# Patient Record
Sex: Female | Born: 1960 | Race: White | Hispanic: No | Marital: Married | Smoking: Never smoker
Health system: Southern US, Academic
[De-identification: ages and names within clinical notes are randomized; demographics above are authoritative.]

## PROBLEM LIST (undated history)

## (undated) DIAGNOSIS — R251 Tremor, unspecified: Secondary | ICD-10-CM

## (undated) DIAGNOSIS — F319 Bipolar disorder, unspecified: Secondary | ICD-10-CM

## (undated) DIAGNOSIS — I10 Essential (primary) hypertension: Secondary | ICD-10-CM

## (undated) DIAGNOSIS — E119 Type 2 diabetes mellitus without complications: Secondary | ICD-10-CM

## (undated) DIAGNOSIS — O039 Complete or unspecified spontaneous abortion without complication: Secondary | ICD-10-CM

## (undated) DIAGNOSIS — E039 Hypothyroidism, unspecified: Secondary | ICD-10-CM

## (undated) DIAGNOSIS — K219 Gastro-esophageal reflux disease without esophagitis: Secondary | ICD-10-CM

## (undated) DIAGNOSIS — E785 Hyperlipidemia, unspecified: Secondary | ICD-10-CM

## (undated) HISTORY — PX: HX HERNIA REPAIR: SHX51

## (undated) HISTORY — PX: ESOPHAGOGASTRODUODENOSCOPY: SHX1529

## (undated) HISTORY — DX: Essential (primary) hypertension: I10

## (undated) HISTORY — DX: Hypothyroidism, unspecified: E03.9

## (undated) HISTORY — PX: HX CATARACT REMOVAL: SHX102

## (undated) HISTORY — DX: Bipolar disorder, unspecified: F31.9

## (undated) HISTORY — DX: Hyperlipidemia, unspecified: E78.5

## (undated) HISTORY — PX: HX GALL BLADDER SURGERY/CHOLE: SHX55

## (undated) HISTORY — PX: COLONOSCOPY: WVUENDOPRO10

## (undated) HISTORY — DX: Complete or unspecified spontaneous abortion without complication: O03.9

## (undated) NOTE — Progress Notes (Signed)
 Formatting of this note might be different from the original.  Subjective   Patient ID: Julie Cain is a 20 y.o. female presenting to the Urgent Care with a chief complaint of Suspicious Skin Lesion (R side of back, concerned about shingles).    History provided by:  Patient  Rash  This is a new problem. Episode onset: x 2 - 3 days. The affected locations include the back. The rash is characterized by blistering, itchiness and pain. She was exposed to nothing.     Objective   BP 118/76 (BP Location: Left arm, Patient Position: Sitting, BP Cuff Size: Large adult)   Pulse 90   Temp 36.6 C (97.9 F) (Oral)   Resp 18   Ht 1.575 m (5' 2)   Wt 97.1 kg (214 lb)   SpO2 99%   BMI 39.14 kg/m     Physical Exam  Vitals and nursing note reviewed.   Constitutional:       Appearance: Normal appearance. She is normal weight.   Skin:     Findings: Rash present.   Neurological:      Mental Status: She is alert and oriented to person, place, and time.         Assessment & Plan    Assessment & Plan  Herpes zoster without complication            In-House Lab Results:   No results found for this or any previous visit.     In-House Imaging Reads:        Procedure Documentation:  Procedures     ED Course & MDM   MDM - Medical Decision Making: Home with return precautions  Electronically signed by Avel Larve, PA-C at 10/27/2023 11:55 AM EDT

---

## 2001-01-10 ENCOUNTER — Other Ambulatory Visit (HOSPITAL_COMMUNITY): Payer: Self-pay

## 2013-09-09 ENCOUNTER — Encounter (INDEPENDENT_AMBULATORY_CARE_PROVIDER_SITE_OTHER): Payer: Self-pay

## 2013-09-09 NOTE — Progress Notes (Signed)
We have received patients records for their new patient visit.  Ralph DowdyJodie Uday Jantz, RN  09/09/2013, 09:27

## 2013-09-16 ENCOUNTER — Ambulatory Visit (INDEPENDENT_AMBULATORY_CARE_PROVIDER_SITE_OTHER): Payer: Self-pay | Admitting: Gastroenterology

## 2020-01-07 ENCOUNTER — Ambulatory Visit: Payer: 59 | Attending: PHYSICIAN ASSISTANT

## 2020-03-05 ENCOUNTER — Other Ambulatory Visit (HOSPITAL_COMMUNITY): Payer: Self-pay

## 2020-03-05 LAB — EXTERNAL COVID-19 MOLECULAR RESULT: External 2019-n-CoV/SARS-CoV-2: POSITIVE — AB

## 2020-09-02 IMAGING — US ABD LIMITED
1 series · 14 of 25 positions shown · non-contrast
Comparison: None.

﻿EXAM: ROMEO PROFESSIONAL READ ABD U/S LMTD
INDICATION: B16.

[Series 1: abd limited · 14 of 45 slices shown]
[im 1/45]
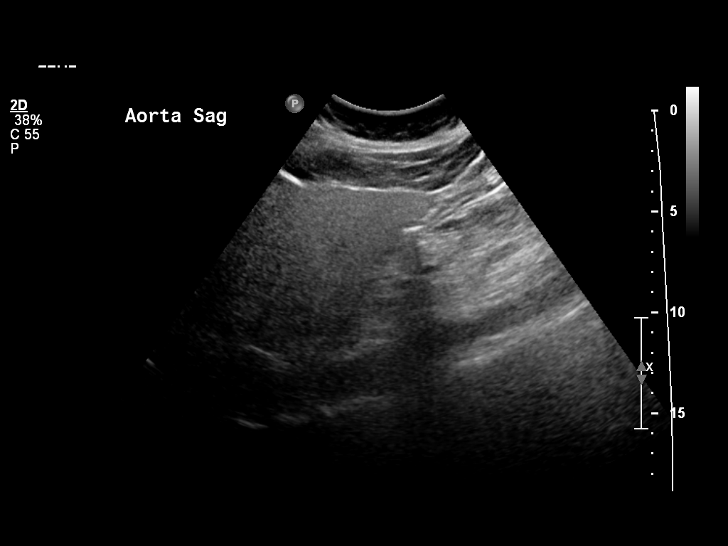
[im 4/45]
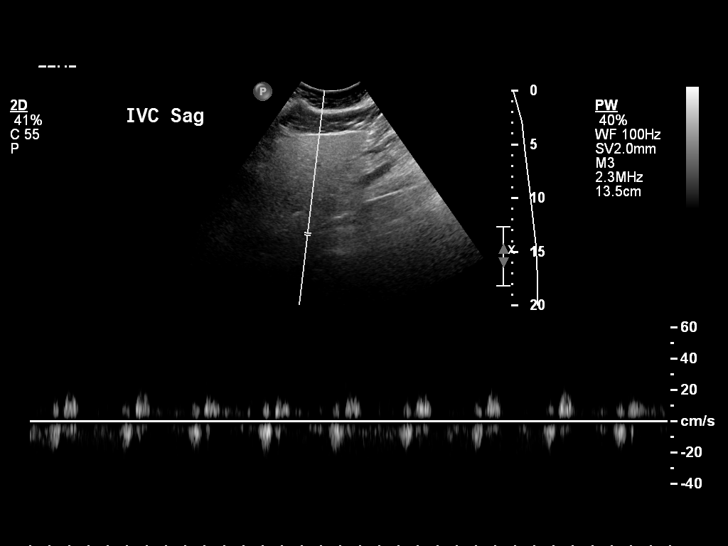
[im 8/45]
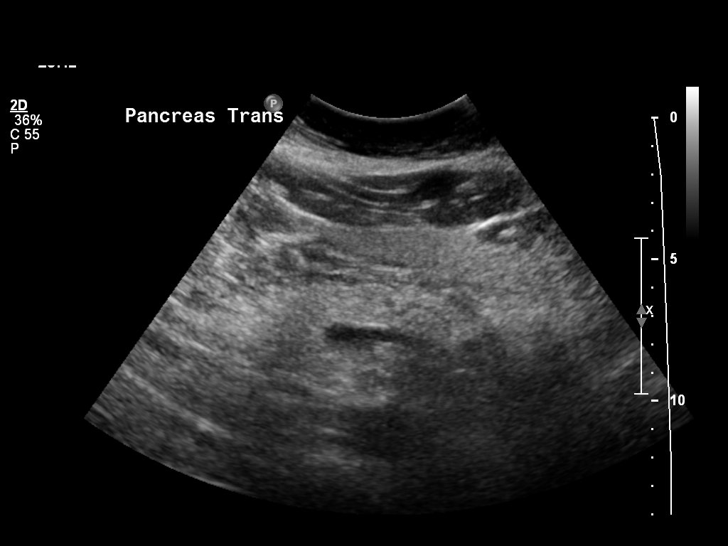
[im 12/45]
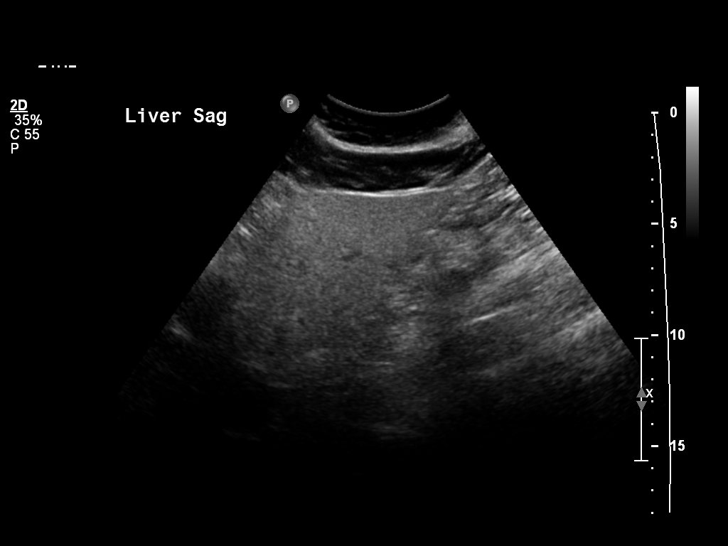
[im 15/45]
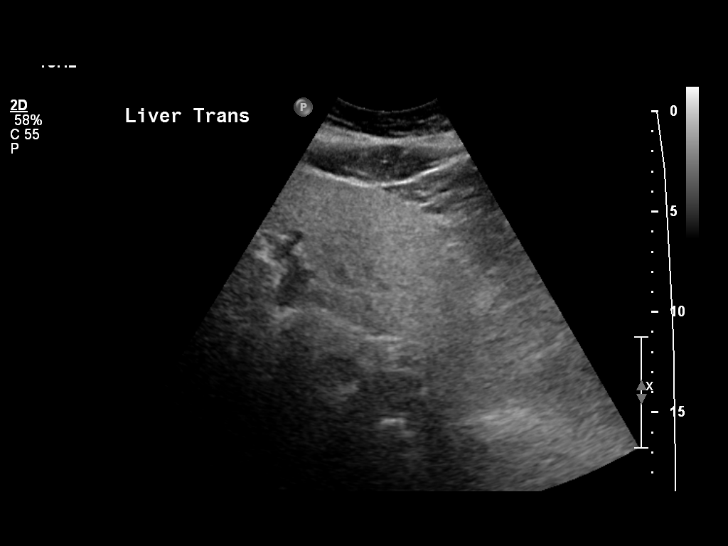
[im 17/45]
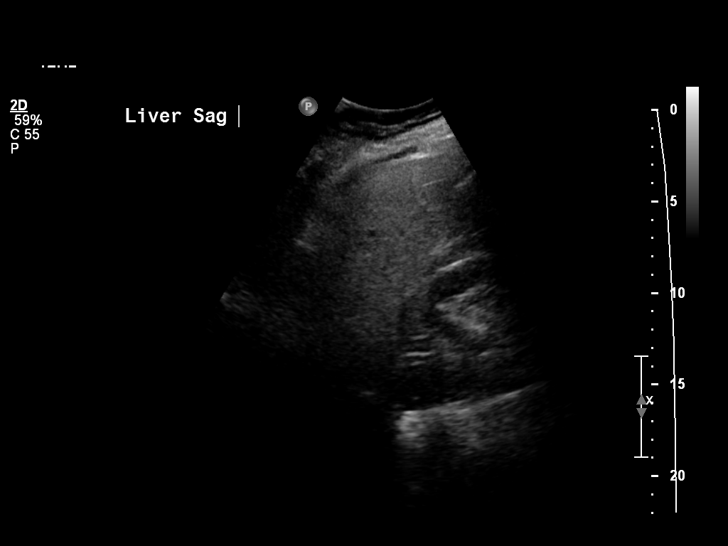
[im 21/45]
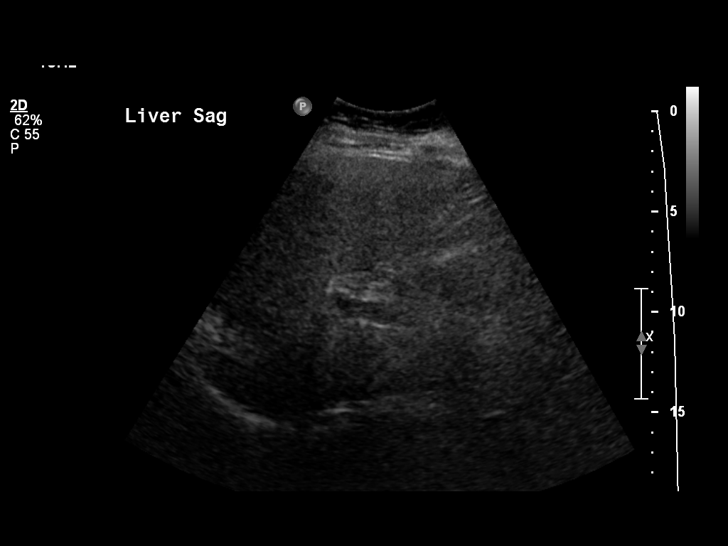
[im 24/45]
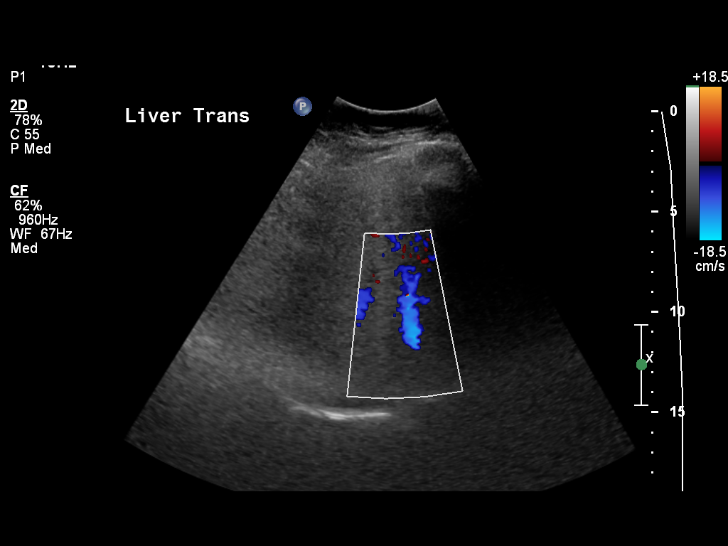
[im 28/45]
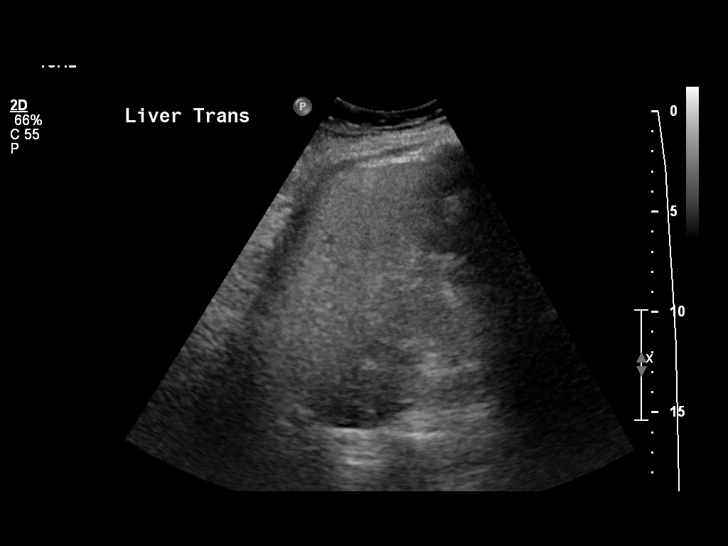
[im 30/45]
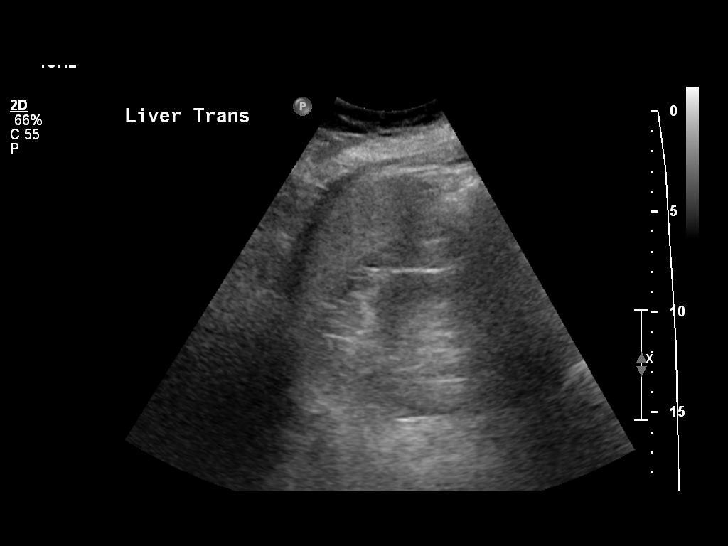
[im 34/45]
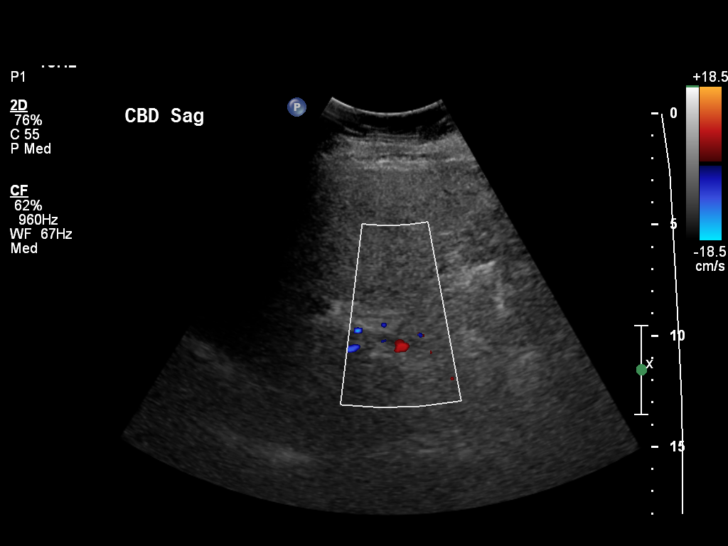
[im 37/45]
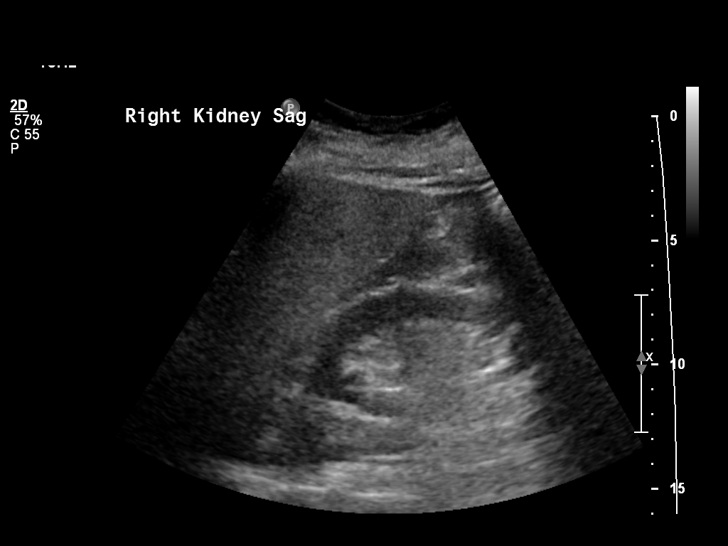
[im 41/45]
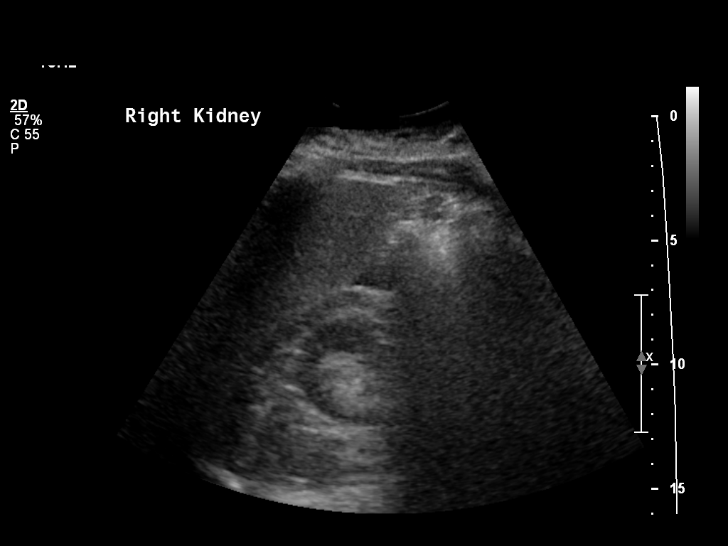
[im 45/45]
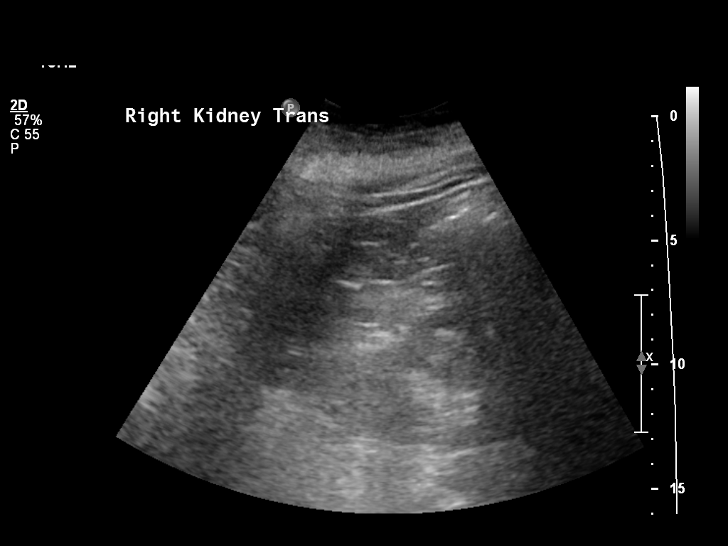

[14 of 25 positions shown; findings below may reference images not displayed]

FINDINGS: Liver is echogenic compatible with fatty infiltration. Fatty infiltration limits evaluation for focal hepatic mass. There is no intra or extrahepatic biliary ductal dilation. Common bile duct measures 2.5 mm. Gallbladder is surgically absent. Pancreas is incompletely visualized due to artifact from overlying bowel gas. Right kidney measures 9.5 cm and is unremarkable. 

Visualized abdominal aorta is without aneurysmal dilatation. IVC is normal. Portal vein measures 9 mm in diameter and demonstrates appropriate hepatopetal direction of flow. Hepatic veins are also patent. There is no ascites.
IMPRESSION: 1. Fatty liver.

2. Prior cholecystectomy.

3. Pancreas incompletely visualized due to artifact from overlying bowel gas.

## 2020-11-15 IMAGING — MR MRI LUMBAR SPINE WITHOUT CONTRAST
5 of 6 series · 32 of 48 positions shown · IV contrast (gadolinium)
Comparison: None available.

﻿EXAM:  [id number]   MRI LUMBAR SPINE WITHOUT CONTRAST
INDICATION: Back pain.
TECHNIQUE: Multiplanar multisequential MRI of the lumbar spine was performed without gadolinium contrast.

[Series 5: T2 · sagittal · 4.0mm · 0.94mm/px · 6 of 13 slices shown (1 of 3)]
[im 1/13]
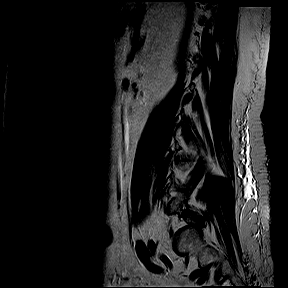
[im 3/13]
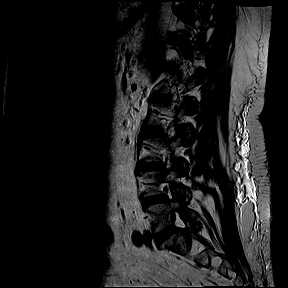
[im 5/13]
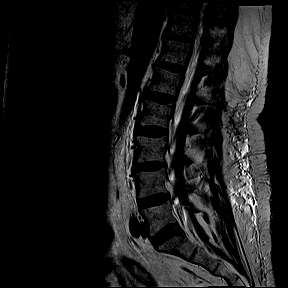
[im 8/13]
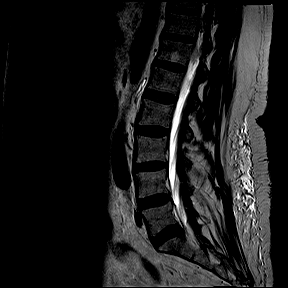
[im 10/13]
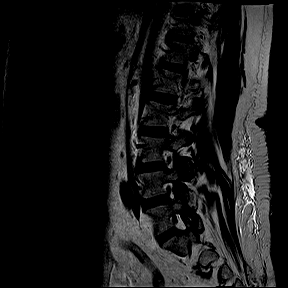
[im 13/13]
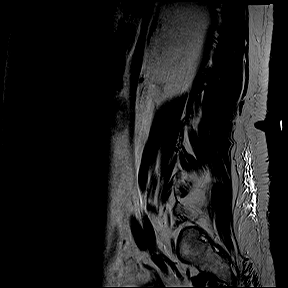

[Series 6: T1 · sagittal · 4.0mm · 0.94mm/px · 6 of 13 slices shown (1 of 2)]
[im 1/13]
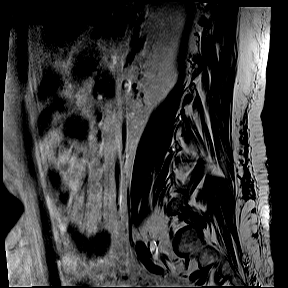
[im 3/13]
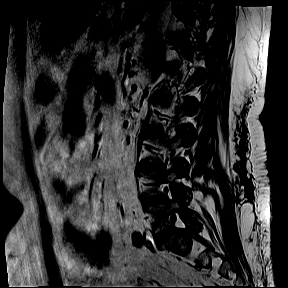
[im 5/13]
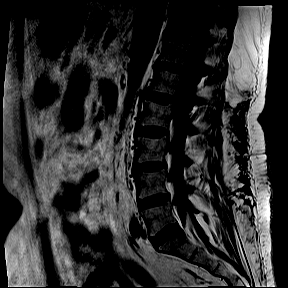
[im 8/13]
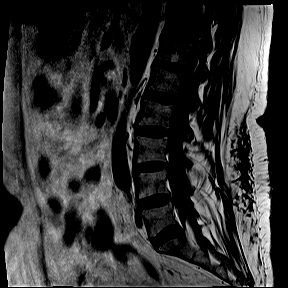
[im 10/13]
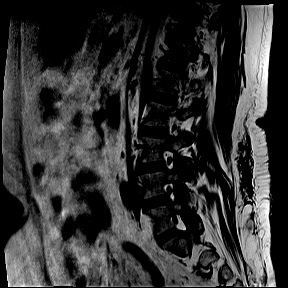
[im 13/13]
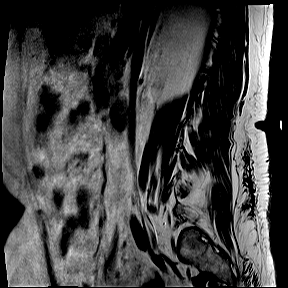

[Series 8: T2 · coronal · 5.0mm · 0.82mm/px · 8 of 18 slices shown (2 of 3)]
[im 1/18]
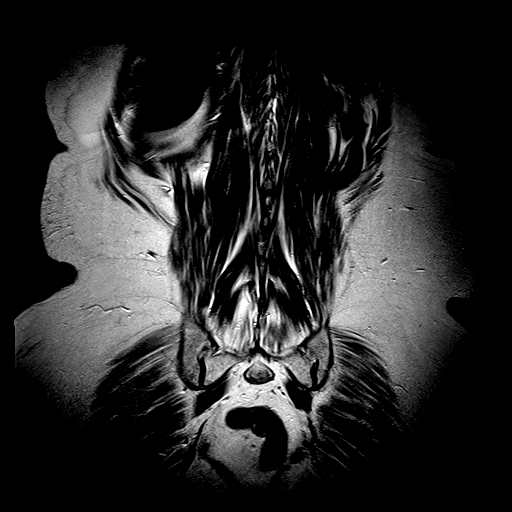
[im 3/18]
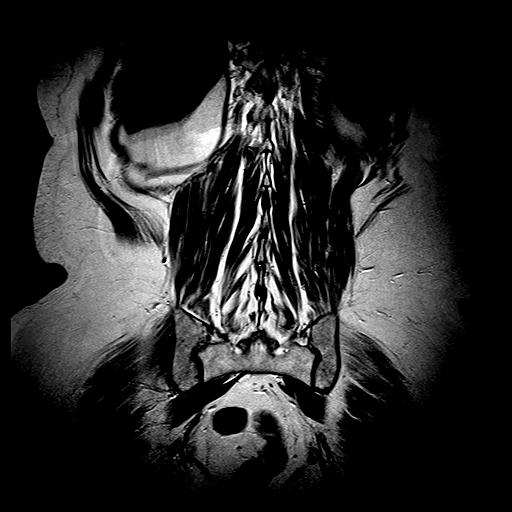
[im 5/18]
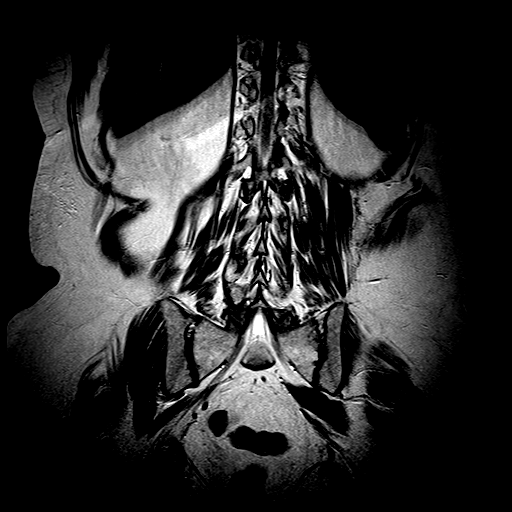
[im 8/18]
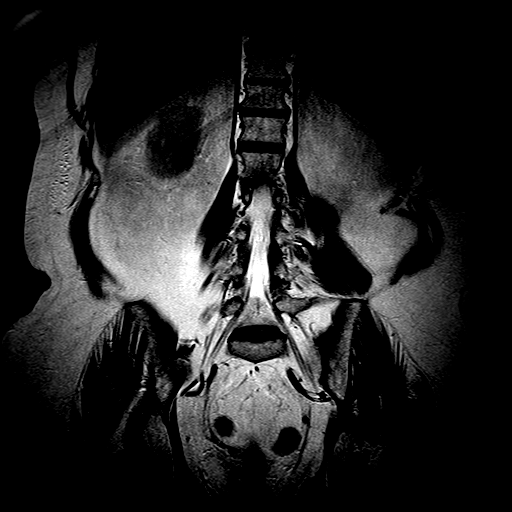
[im 10/18]
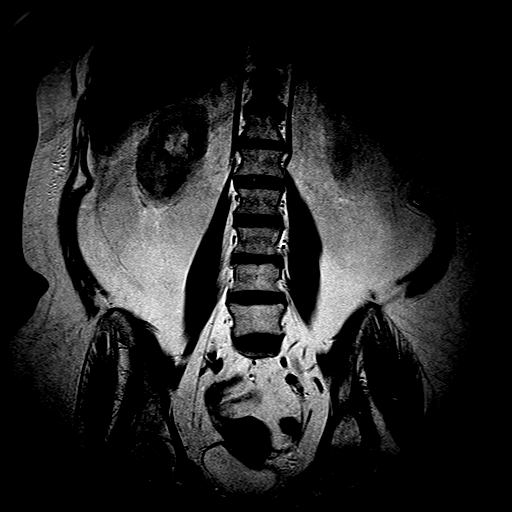
[im 13/18]
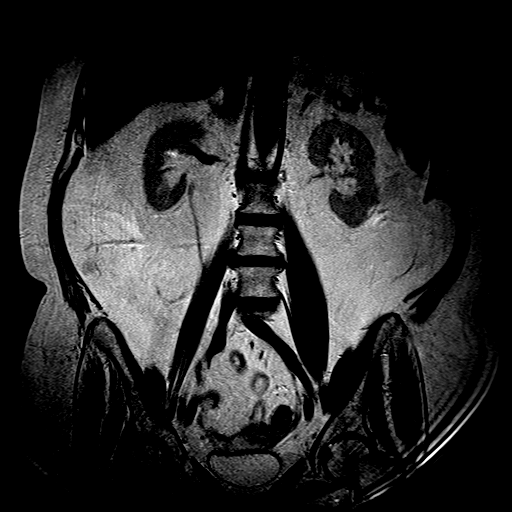
[im 15/18]
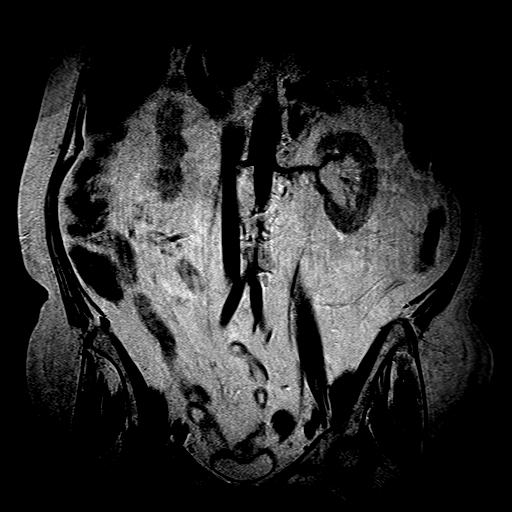
[im 18/18]
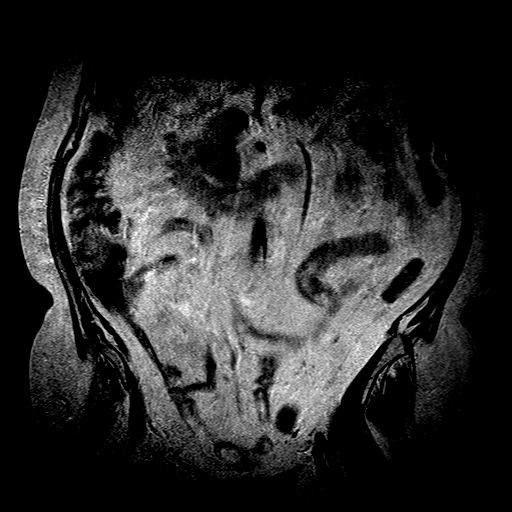

[Series 9: T2 · axial · 4.0mm · 0.52mm/px · z∈[-117,+150]mm · 11 of 26 slices shown (3 of 3)]
[im 1/26]
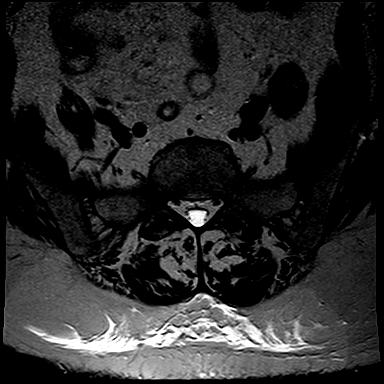
[im 3/26]
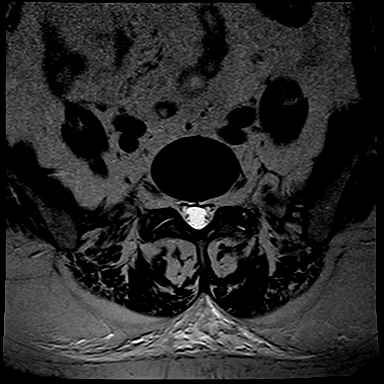
[im 6/26]
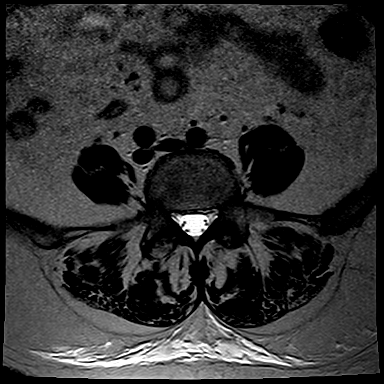
[im 8/26]
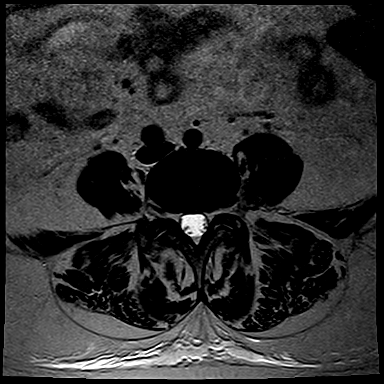
[im 11/26]
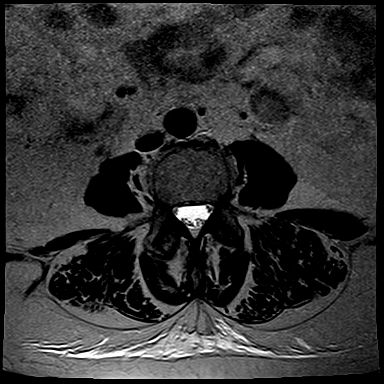
[im 13/26]
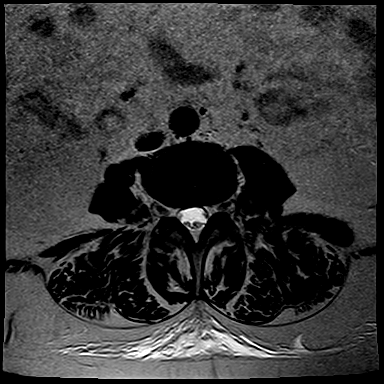
[im 16/26]
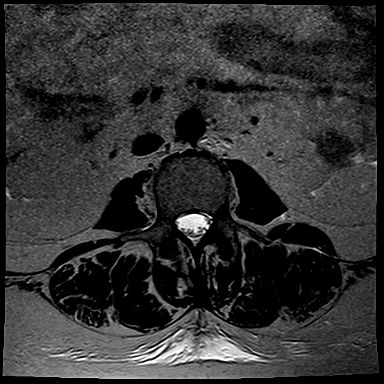
[im 18/26]
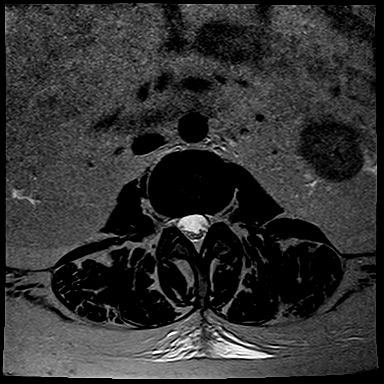
[im 21/26]
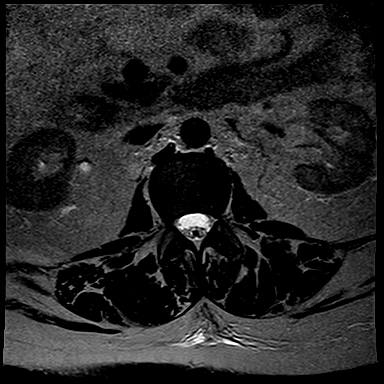
[im 23/26]
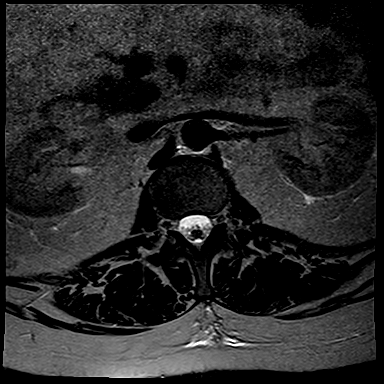
[im 26/26]
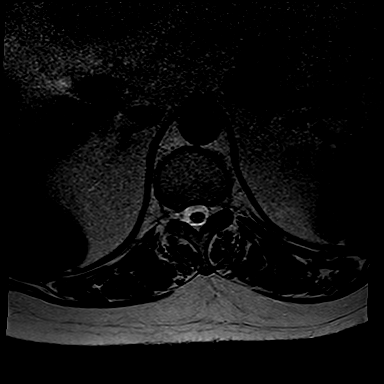

[Series 10: T1 · axial · 4.0mm · 0.52mm/px · 1 of 26 slices shown (2 of 2)]
[im 1/26]
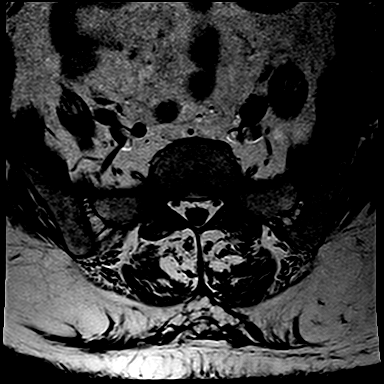

[32 of 48 positions shown; findings below may reference images not displayed]

FINDINGS: Vertebral bodies are normal in height, alignment and signal intensity. There is no acute fracture or subluxation. Distal spinal cord is normal in signal intensity and terminates normally at L1 vertebral body level. Spinal canal is congenitally narrow.

There is a minimal bulging annulus at T11-12 and T12-L1 levels, minimally effacing the ventral thecal sac. There is no significant neural foraminal stenosis at these levels.

L1-2 and L2-3 levels are unremarkable.

At L3-4 level, there is mild bilateral neural foraminal stenosis from facet arthropathy and bulging annulus without nerve root impingement.

At L4-5 level, there is mild-to-moderate right and mild left neural foraminal stenosis from facet arthropathy and bulging annulus.

L5-S1 level and paraspinal soft tissues are unremarkable.
IMPRESSION: 1. No significant disc herniation or spinal stenosis at any level. 

2. Multilevel neural foraminal stenosis as detailed above.

## 2020-11-15 IMAGING — MR MRI THORACIC SPINE WITHOUT CONTRAST
4 of 5 series · 26 of 48 positions shown · IV contrast (gadolinium)
Comparison: None available.

﻿EXAM:  [id number]   MRI THORACIC SPINE WITHOUT CONTRAST
INDICATION: Mid back pain.
TECHNIQUE: Multiplanar multisequential MRI of the thoracic spine was performed without gadolinium contrast.

[Series 5: T2 · sagittal · 4.0mm · 0.78mm/px · 8 of 13 slices shown (1 of 2)]
[im 1/13]
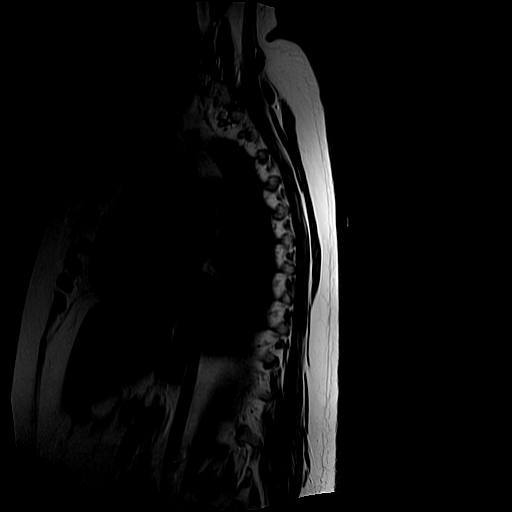
[im 2/13]
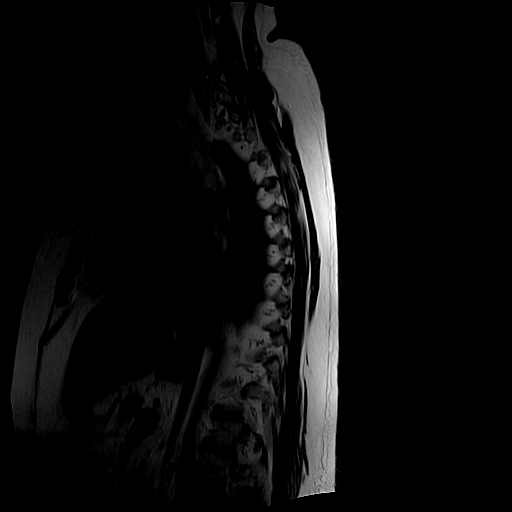
[im 5/13]
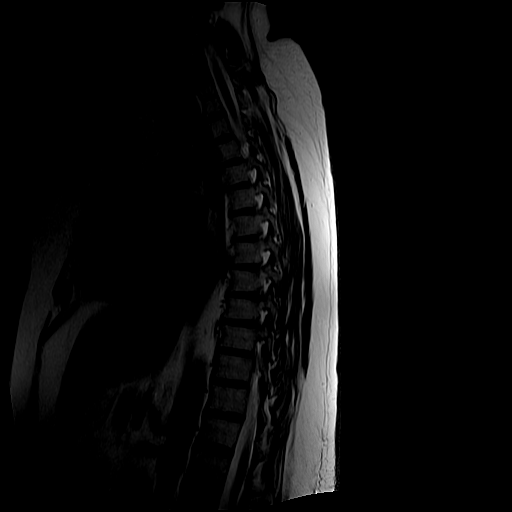
[im 6/13]
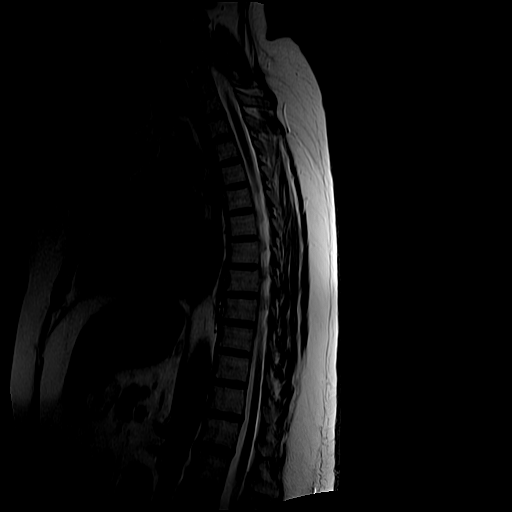
[im 7/13]
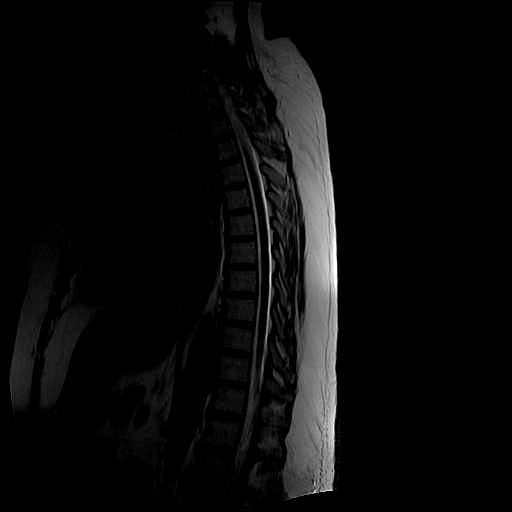
[im 9/13]
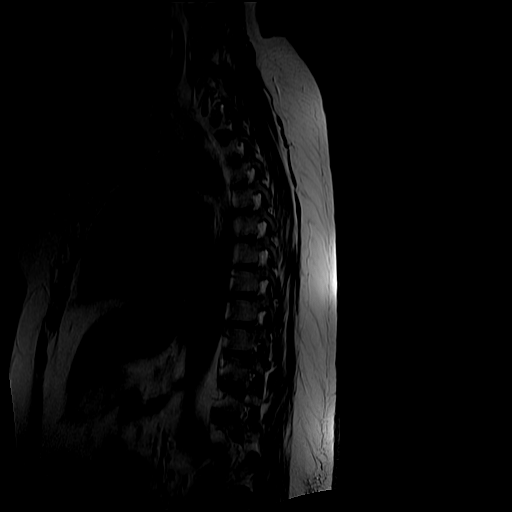
[im 11/13]
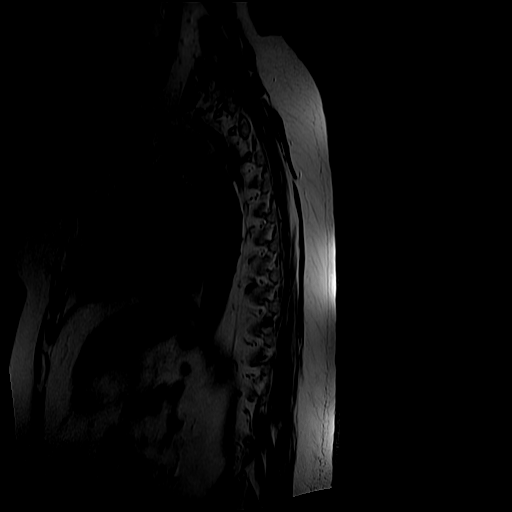
[im 13/13]
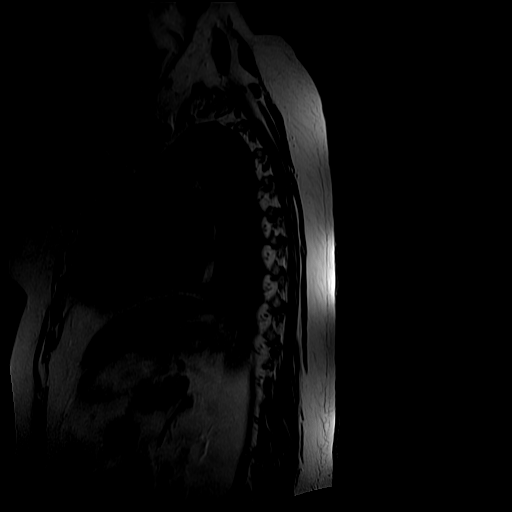

[Series 6: T1 · sagittal · 4.0mm · 0.78mm/px · 6 of 13 slices shown (1 of 2)]
[im 1/13]
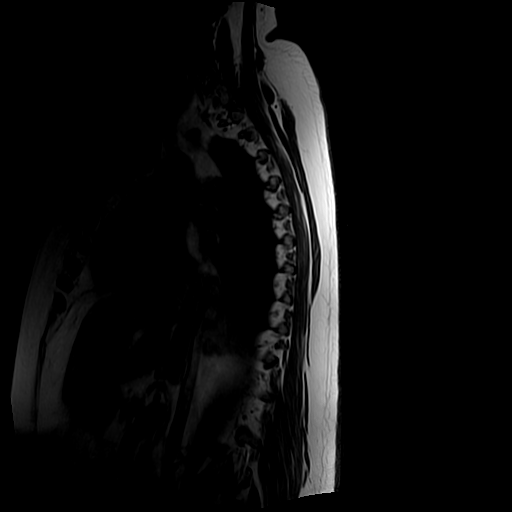
[im 2/13]
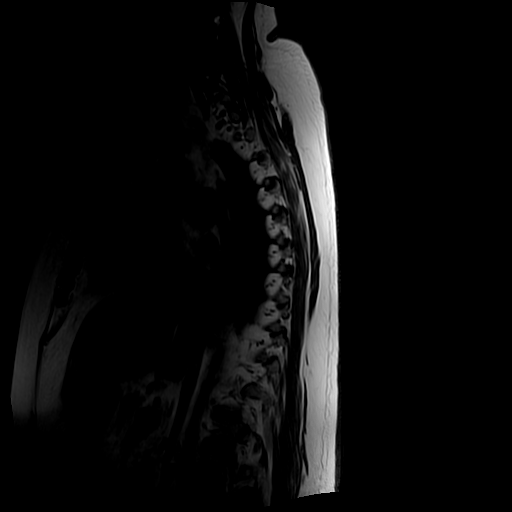
[im 5/13]
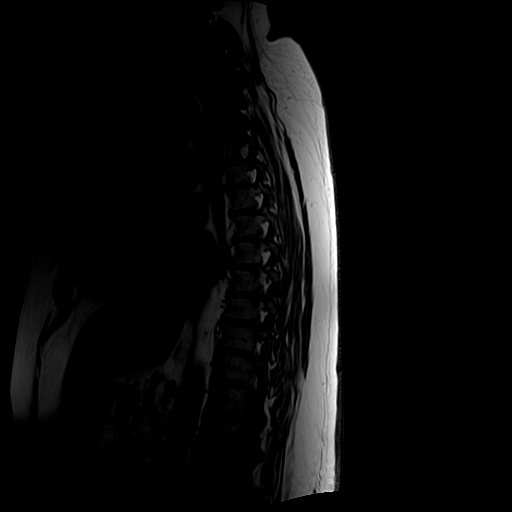
[im 6/13]
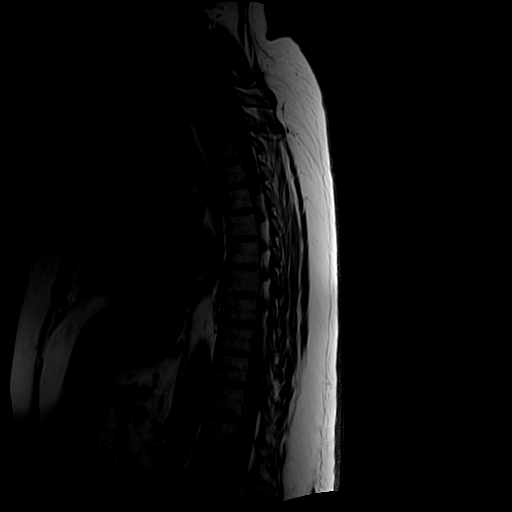
[im 7/13]
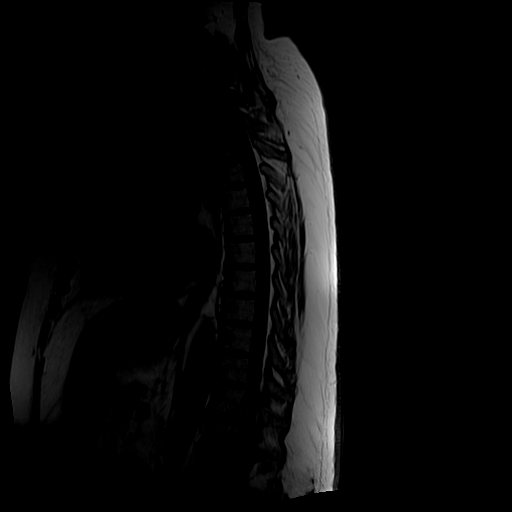
[im 11/13]
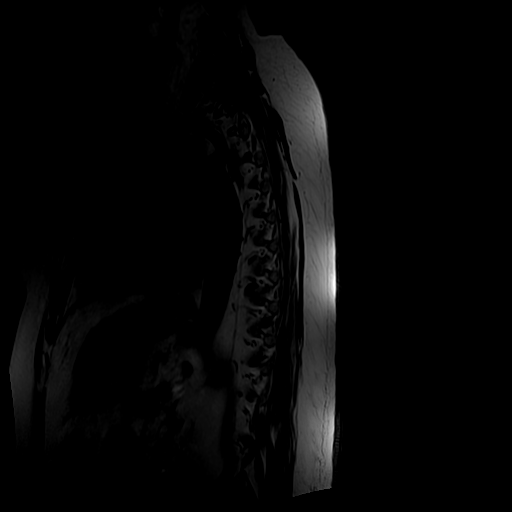

[Series 8: T2 · axial · 4.0mm · 0.62mm/px · z∈[-289,-89]mm · 9 of 12 slices shown (2 of 2)]
[im 1/12]
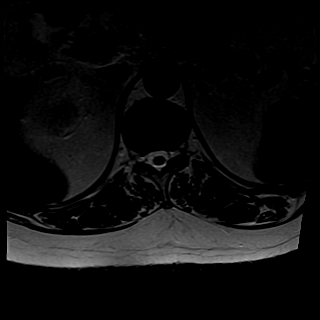
[im 2/12]
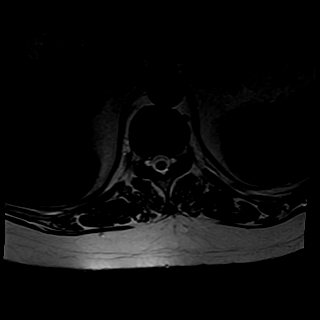
[im 3/12]
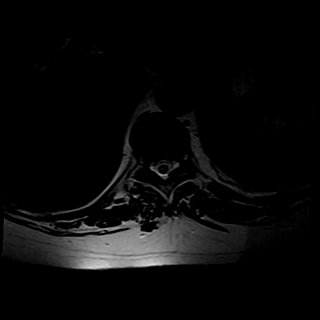
[im 5/12]
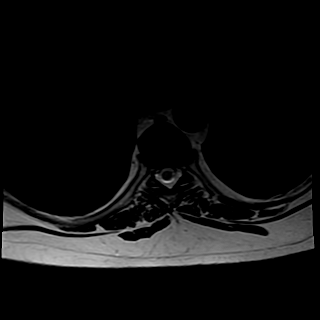
[im 6/12]
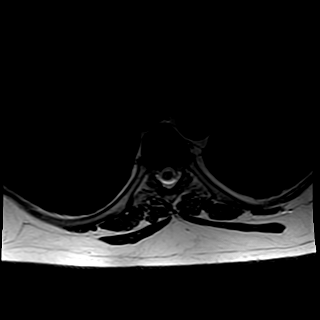
[im 7/12]
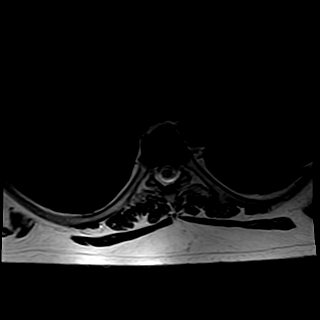
[im 9/12]
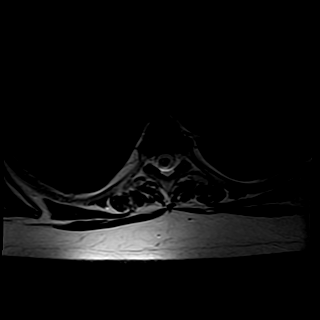
[im 10/12]
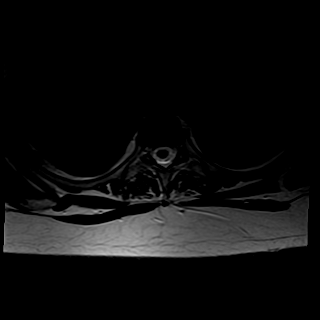
[im 12/12]
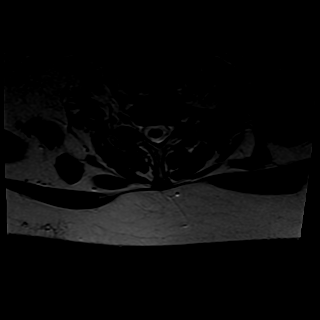

[Series 9: T1 · axial · 4.0mm · 0.62mm/px · z∈[-266,-126]mm · 3 of 12 slices shown (2 of 2)]
[im 2/12]
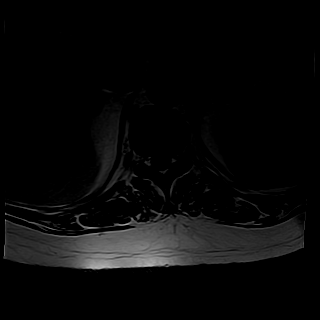
[im 6/12]
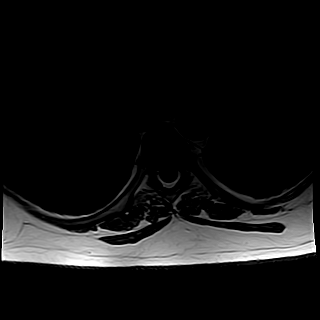
[im 10/12]
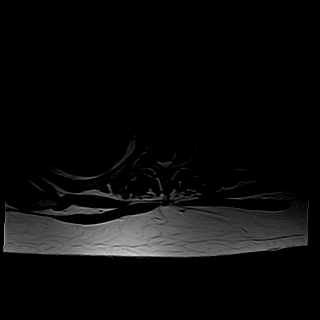

[26 of 48 positions shown; findings below may reference images not displayed]

FINDINGS: Vertebral bodies are normal in height, alignment and signal intensity. There is no acute fracture or subluxation. Visualized spinal cord is normal in signal intensity without evidence of compression at any level.

There is no significant disc herniation, spinal canal or neural foraminal stenosis at any level.

Paraspinal soft tissues are unremarkable. There is no pleural effusion.
IMPRESSION: Unremarkable exam.

## 2021-04-05 IMAGING — MR MRI BRAIN WITHOUT AND WITH CONTRAST
10 of 13 series · 35 of 48 positions shown · IV contrast (gadavist)
Comparison: None available.

﻿EXAM:  MRI BRAIN WITHOUT AND WITH CONTRAST
INDICATION: Headaches and dizziness.
TECHNIQUE: Multiplanar multisequential MRI of the brain was performed without and with 5 mL of Gadavist.

[Series 5: DWI · axial · 5.0mm · 1.35mm/px · z∈[-66,+60]mm · 9 of 88 slices shown (1 of 3)]
[im 1/88]
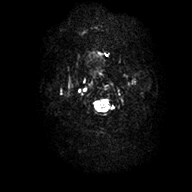
[im 16/88]
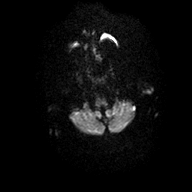
[im 24/88]
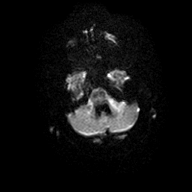
[im 40/88]
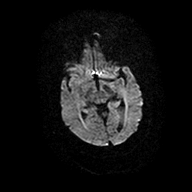
[im 48/88]
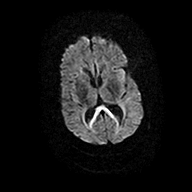
[im 64/88]
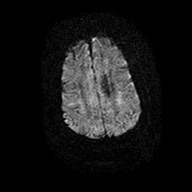
[im 72/88]
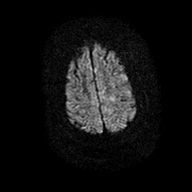
[im 80/88]
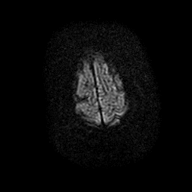
[im 88/88]
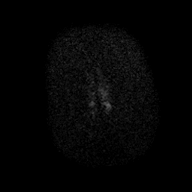

[Series 6: DWI · axial · 5.0mm · 1.35mm/px · z∈[-66,+60]mm · 3 of 22 slices shown (2 of 3)]
[im 1/22]
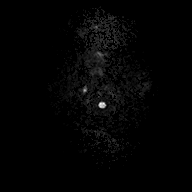
[im 11/22]
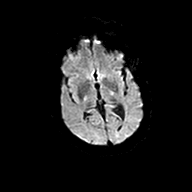
[im 22/22]
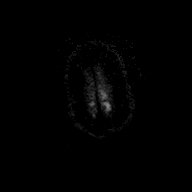

[Series 7: DWI · axial · 5.0mm · 1.35mm/px · z∈[-66,+60]mm · 3 of 22 slices shown (3 of 3)]
[im 1/22]
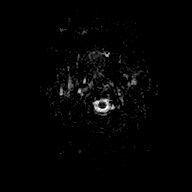
[im 11/22]
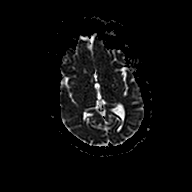
[im 22/22]
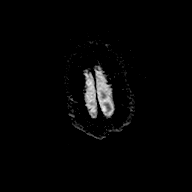

[Series 8: FLAIR · sagittal · 4.0mm · 0.75mm/px · 3 of 26 slices shown (1 of 2)]
[im 1/26]
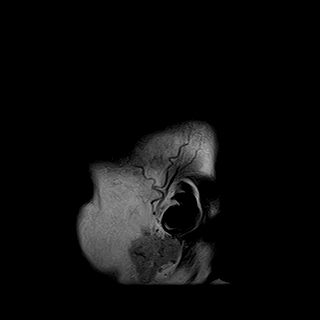
[im 13/26]
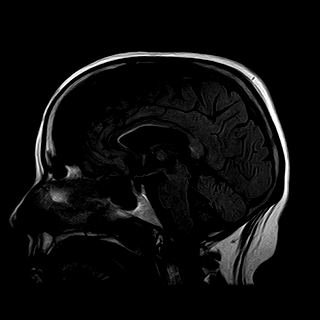
[im 26/26]
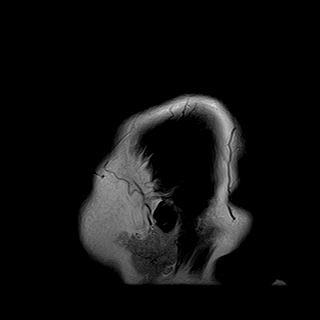

[Series 9: T2 · axial · 5.0mm · 0.43mm/px · z∈[-78,+66]mm · 3 of 25 slices shown (1 of 2)]
[im 1/25]
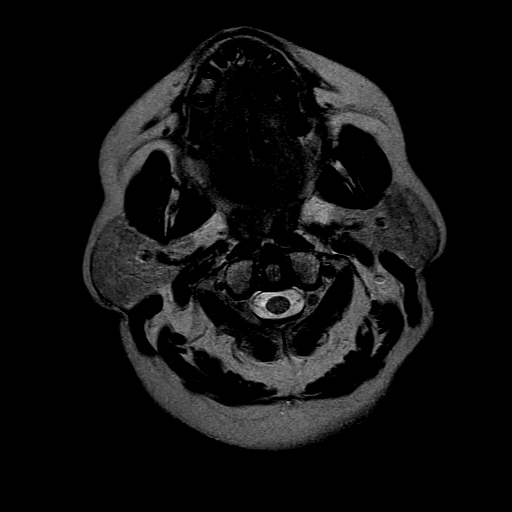
[im 13/25]
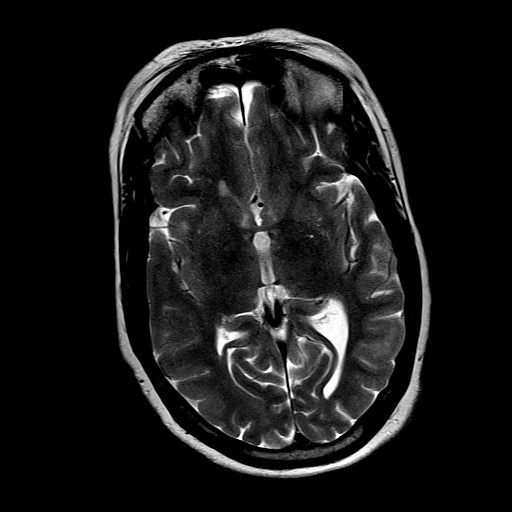
[im 25/25]
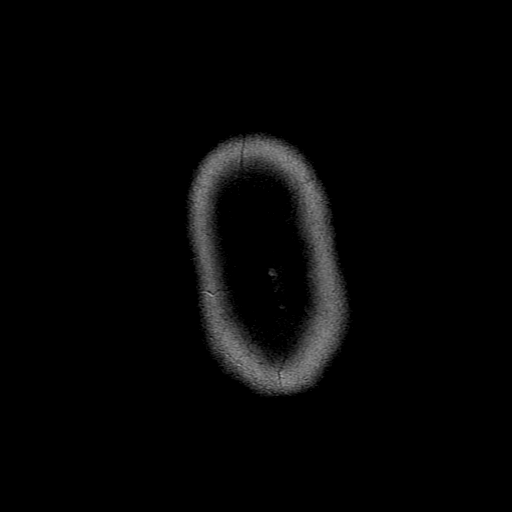

[Series 10: FLAIR · axial · 5.0mm · 0.43mm/px · z∈[-78,+66]mm · 3 of 25 slices shown (2 of 2)]
[im 1/25]
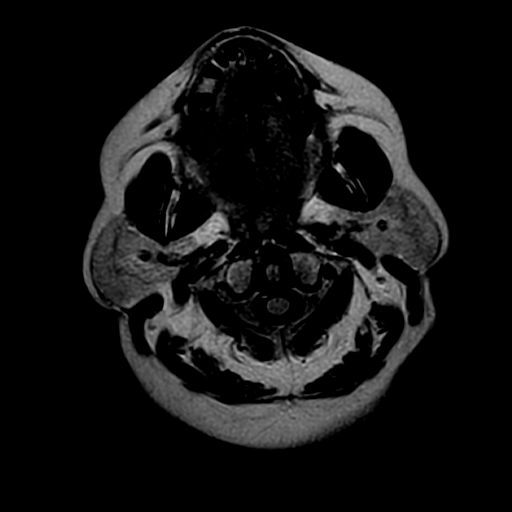
[im 13/25]
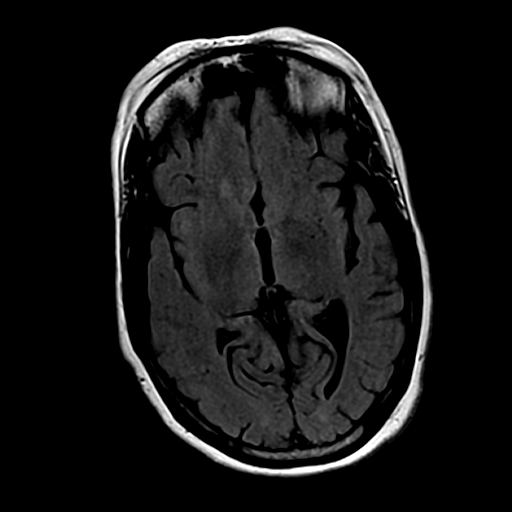
[im 25/25]
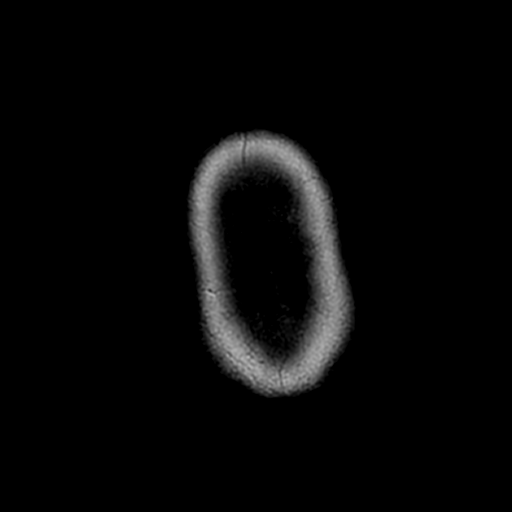

[Series 11: T1 · axial · 5.0mm · 0.43mm/px · z∈[-78,+66]mm · 3 of 25 slices shown]
[im 1/25]
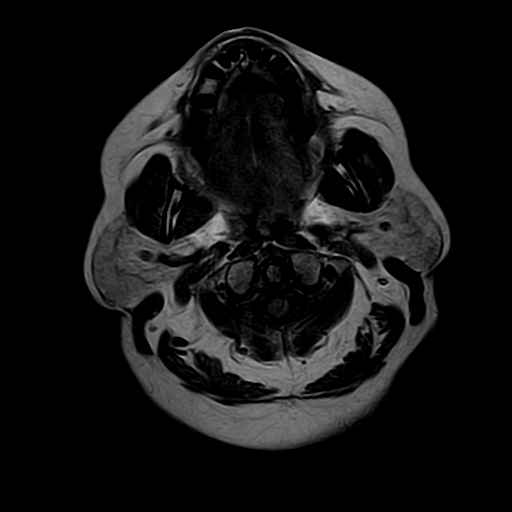
[im 13/25]
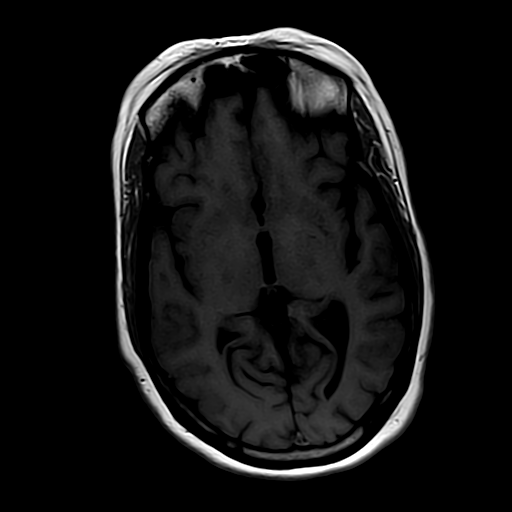
[im 25/25]
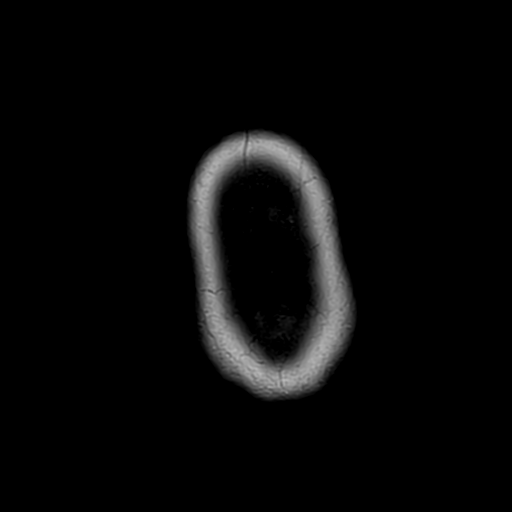

[Series 13: T2 · coronal · 6.0mm · 0.43mm/px · 3 of 24 slices shown (2 of 2)]
[im 1/24]
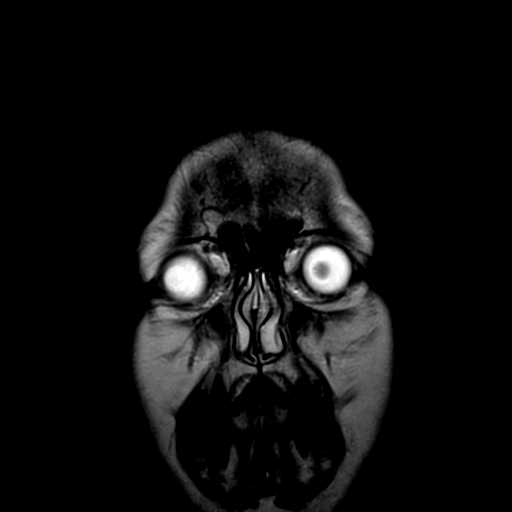
[im 12/24]
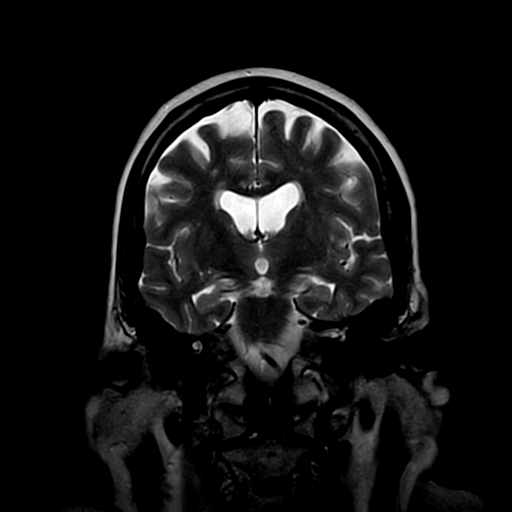
[im 24/24]
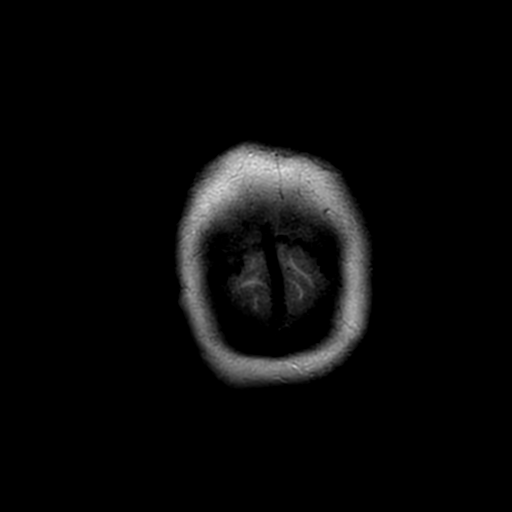

[Series 14: T1 fat-sat · coronal · 5.0mm · 0.57mm/px · 3 of 24 slices shown (1 of 2)]
[im 1/24]
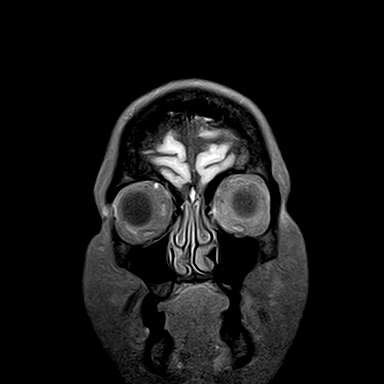
[im 12/24]
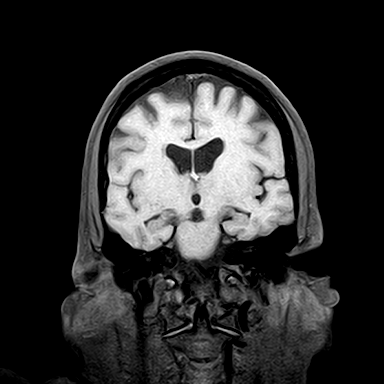
[im 24/24]
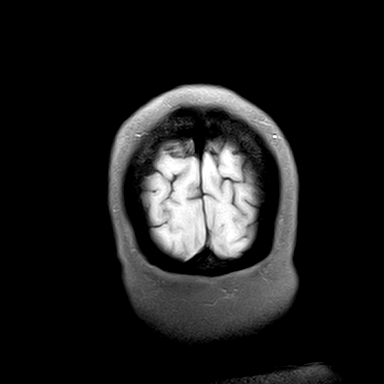

[Series 15: T1 fat-sat · axial · 5.0mm · 0.43mm/px · z∈[-78,-6]mm · 2 of 25 slices shown (2 of 2)]
[im 1/25]
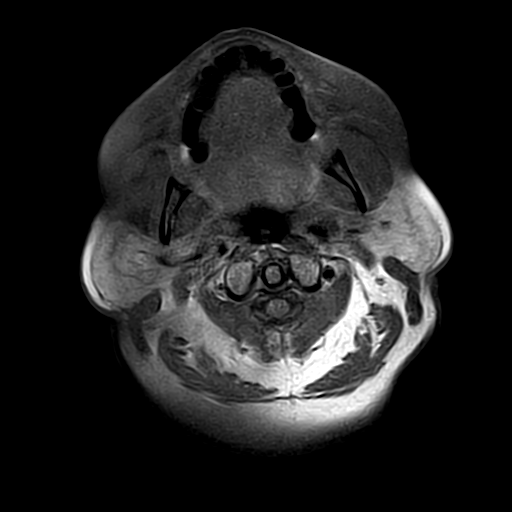
[im 13/25]
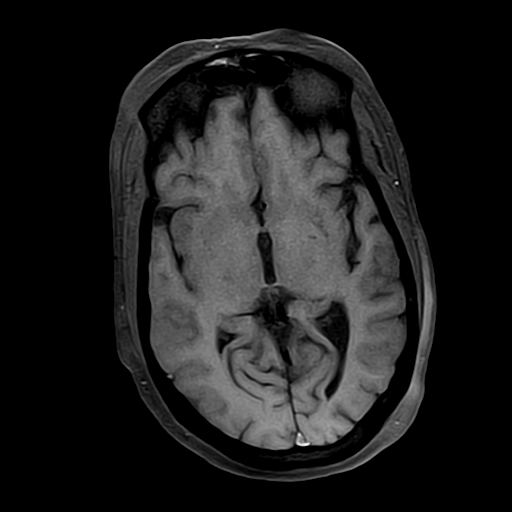

[35 of 48 positions shown; findings below may reference images not displayed]

FINDINGS: Ventricular and sulcal size is normal for the patient's age. There are mild chronic small vessel ischemic changes. There is no mass effect, midline shift or intracranial hemorrhage. There is no evidence of acute infarction or prior microhemorrhages. Skull base flow voids and basal cisterns are patent.  Sagittal survey of midline structures is unremarkable. Following intravenous contrast administration, there is no abnormal parenchymal or leptomeningeal enhancement.  There are no extra-axial fluid collections. Visualized paranasal sinuses, mastoid air cells and orbital contents are unremarkable.
IMPRESSION: 1. Mild chronic small vessel ischemic changes, no acute intracranial abnormality. 

 2. No abnormal parenchymal or leptomeningeal enhancement.

## 2021-06-16 ENCOUNTER — Other Ambulatory Visit (HOSPITAL_COMMUNITY): Payer: Self-pay | Admitting: Family

## 2021-06-16 DIAGNOSIS — K760 Fatty (change of) liver, not elsewhere classified: Secondary | ICD-10-CM

## 2021-08-18 ENCOUNTER — Ambulatory Visit (HOSPITAL_COMMUNITY): Payer: Self-pay

## 2021-08-21 ENCOUNTER — Other Ambulatory Visit (HOSPITAL_COMMUNITY): Payer: Self-pay | Admitting: PSYCHIATRY AND NEUROLOGY-NEUROLOGY

## 2021-08-21 DIAGNOSIS — G20 Parkinson's disease: Secondary | ICD-10-CM

## 2021-09-05 ENCOUNTER — Other Ambulatory Visit: Payer: Self-pay

## 2021-09-05 ENCOUNTER — Ambulatory Visit
Payer: 59 | Attending: PSYCHIATRY AND NEUROLOGY-NEUROLOGY | Admitting: Student in an Organized Health Care Education/Training Program

## 2021-09-05 ENCOUNTER — Encounter (INDEPENDENT_AMBULATORY_CARE_PROVIDER_SITE_OTHER): Payer: Self-pay | Admitting: Student in an Organized Health Care Education/Training Program

## 2021-09-05 DIAGNOSIS — F3189 Other bipolar disorder: Secondary | ICD-10-CM

## 2021-09-05 DIAGNOSIS — G20 Parkinson's disease: Secondary | ICD-10-CM | POA: Insufficient documentation

## 2021-09-05 NOTE — H&P (Signed)
NEW PATIENT EVALUATION NOTE     Patient: Julie Cain  Medical Record Number: # [id number]  Date of Birth: 08-Oct-1960  Date of visit: 09/05/2021     Referring provider: Loma Sender, MD        The patient was not accompanied today. History obtained from patient     CC: parkinsonism        HISTORY OF PRESENT ILLNESS:  61 y.o. 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       Her first symptom was tremor that started in her hands and it was symmetric, she has had it at least since covid pandemic (initially said "a while"), later on her head and even trunk shake. Notices tremor more when trying to hold something with her hands, today is worse than the usual as she is nervous. Tremors started before the risperidal but was on VPA.  Daughter mentioned she had tremors while on geodon.                  Sense of smell: normal             Dysarthria/Hypophonia: no             Dysphagia: no             Sialorrhea: yes during both day and night, mostly on the right side              Mood: bipolar disorder first manic episode at age 38             Hallucinations: + auditory but no visual              Urinary changes: no             Constipation: seems to fluctuate but does not sound like a main issue, does say she had incontinence because sometimes she has abundant stools and cannot hold it, she has seen GI for this and has colonoscopy and endoscopy              Orthostasis: no             Stiffness: not really             Slowness: she moves slow on purpose because she feels unsteady on her feet             Micrographia: no but sloppy because of the tremor              Gait: feels unbalanced "for a while" does have lower back issues so she is not very active,              Freezing: no             Falls: a couple  seldomly, last one was a "while ago" in the context of running to the bathroom             Sleep issues:                          -OSA: could not sleep during the test, needs to call about that                          -  RBD: nobody has mention that                           Driving/Safety: yes no issues     Smoking history: no  Etoh use history: no  Illicit drug use history: no  Neuroleptic/ toxic exposure: Took depakote and abilify, currently risperidal is being increased for auditory hallucinations, has been on it for a "while", her psych says there is no other medications she can try for her symptoms as she has tried several meds (patient does not recalll all), has been on geodon too   Family history: dad had severe tremors when older but was never diagnosed, estranged sister might have had tremors but patient did not witness    prior evaluations: no records available for review . says she has not had neck imaging         Current medications tried for this issue:   none for the tremor      Past medications tried for this issue:   no          Current Living Situation/ education and work history: Patient lives 4h away, lives with son. she was a Research scientist (medical).           REVIEW OF PAST MEDICAL, SOCIAL AND FAMILY HISTORY:  This is the list of problems as per our Medical Records:     There are no problems to display for this patient.       Past Medical History:   Diagnosis Date    Bipolar disorder, unspecified (CMS HCC)     HTN (hypertension)     Hyperlipidemia     Hypothyroidism     Miscarriage     x3       History reviewed. No past surgical history pertinent negatives.          Allergies   Allergen Reactions    Metformin Nausea/ Vomiting         Medications were reviewed and confirmed with patient and/or family.   Outpatient Encounter Medications as of 09/05/2021   Medication Sig Dispense Refill    amLODIPine (NORVASC) 5 mg Oral Tablet Take 1 Tablet (5 mg total) by mouth Once a day      atorvastatin (LIPITOR) 40 mg Oral  Tablet Take 1 Tablet (40 mg total) by mouth Every night      HUMALOG KWIKPEN INSULIN 100 unit/mL Subcutaneous Insulin Pen       lamoTRIgine (LAMICTAL) 100 mg Oral Tablet Take 1 Tablet (100 mg total) by mouth Twice daily      levothyroxine (SYNTHROID) 112 mcg Oral Tablet       metoprolol succinate (TOPROL-XL) 50 mg Oral Tablet Sustained Release 24 hr       nitroGLYCERIN (NITROSTAT) 0.4 mg Sublingual Tablet, Sublingual       omeprazole (PRILOSEC) 40 mg Oral Capsule, Delayed Release(E.C.) TAKE 1 CAPSULE BY MOUTH DAILY BEFORE AND DINNER      ONETOUCH VERIO TEST STRIPS Strip USE TO TEST BLOOD SUGAR TWICE DAILY      risperiDONE (RISPERDAL) 0.5 mg Oral Tablet Take 1 Tablet (0.5 mg total) by mouth Every night      risperiDONE (RISPERDAL) 2 mg Oral Tablet Take 1 Tablet (2 mg total) by mouth Every night      SEMGLEE,INSULIN GLARG-YFGN,PEN 100 unit/mL (3 mL) Subcutaneous Insulin Pen        No facility-administered encounter medications on file as of 09/05/2021.  Social History     Tobacco Use    Smoking status: Never    Smokeless tobacco: Never   Substance Use Topics    Alcohol use: Not on file         Family Medical History:    None          REVIEW OF SYSTEMS:  The patient has entered data on an intake form regarding present illness, past medical and surgical history, medications, allergies, family and social history, and a full review of 14 systems. I have reviewed this form with the patient, and all the relevant information has been included on this note. The full review of systems was negative except as stated in HPI and below.          PHYSICAL EXAMINATION:      BP 131/79   Pulse 92   Temp 36.3 C (97.4 F) (Thermal Scan)   Wt 94.2 kg (207 lb 9.6 oz)   SpO2 93%          General Medical Examination:  Constitutional: Well-appearing, well nourished, pleasant patient in no acute distress  HEENT: Normocephalic, atraumatic. Eyes: non icteric. Mouth: moist mucus membranes Neck normal position   Cardiovascular: Warm and  well perfused   Respiratory: Breathing easily on room air   GI: non distended   Musculoskeletal: no lower ext edema noted   Skin: no visible rash  Psychiatric: Mood/Affect: normal/appropriate     Neurologic Exam      Mental Status: Awake and alert, good hygiene and appropriate appearance. Oriented to situation and good attention span. Following commands well. Participating in history and conversation. Fluent and articulate without evidence of aphasia or dysarthria. Normal volume of voice. +hypomimia.                Cranial nerves:  I:  Not tested  II:  PERRL, VFF to confrontation  III, IV, VI: EOMI with conjugate gaze and no nystagmus on end gaze, smooth pursuit, saccades normal in vertical and horizontal directions    V: Facial sensation intact and symmetric over the bilateral V1-V3  VII: Facial muscle activation intact and symmetric over the bilateral upper and lower face, blinking reduced  VIII: Hearing intact during regular conversation  IX, X, XII: TUP midline, able to move tongue from left to right  XI: Shoulder shrug full strength b/l and symmetric     Motor:             Tone: rigidity see updrs             Fasciculations/Atrophy: no             5/5 strength in all 4 ext, both distally and proximally              Bradykinesia: see updrs             Tremor: resting tremor L>R, rememergent tremor, action 2, postural 1              Dystonia: no.             Dyskinesia: no.             Myoclonus: no.             Chorea: no.             Tics: no.             Stereotypies: no.     DTRs:    Biceps Brachioradialis Knee Ankle  Left 2 2 2 2    Right 2 2 2 2        Sensation: Normal sensation in upper and lower ext to light touch                 Coordination:  Finger-to-nose-finger: no dysmetria       Gait: Extensive ambulation testing was performed.  This included slow raising from a chair, normal stance. Gait showed narrow base, short stepping, good and stable turns, decreased arm swings.  Pull test was normal.       UPDRS          REVIEW OF ANCILLARY TESTS:              Other Testing:     No results found for: WBC, HGB, HCT, MCV, PLT  No results found for: NA, K, ANIONGAP, CO2, BUN, GLUCOSE, CALCIUM, ALBUMIN, AST, ALT, ALTPOC, ALKPHOS  No results found for: TSH, T3, T4TOTAL, T4, FREET4, T3UP, TUP, TSI  No components found for: VITAMINB12, VB12  No results found for: HGBA1C          Diagnoses for this encounter:    ICD-10-CM    1. Parkinsonism, unspecified Parkinsonism type (CMS HCC)  G20               ASSESSMENT and PLAN      Julie Cain is a R handed 61 y.o. female with relevant PMH of bipolar disorder who presents for evaluation  of parkinsonism. Patient has been exposed to multiple antipsychotics and is currently on one as well, DAT scan would clarify if primary or secondary but would need to be off Abilify for sometime before, it seems her mood has been extremely hard to control and she still has auditory hallucinations so she is not keen on risking a decompensation for a test, I think this is understandable. 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  I still think is worth to do a C/L trial, if response is dramatic then it points out more to primary but DIP can also have modest benefit on it.     - recommend trial of carbidopa/levodopa , I would personally titrate her to at least 3 tabs TID depending on response  - Patient prefers to follow with Dr Renelda Mom closer to home      -Zamiya was seen today for new patient.    Diagnoses and all orders for this visit:    Parkinsonism, unspecified Parkinsonism type (CMS HCC)  -     Refer to [facility]      -There are no Patient Instructions on file for this visit.    -No orders of the defined types were placed in this encounter.                - The patient has been instructed to call Lurena Joiner about any new neurological problems or medication side effects.         Follow-up: PRN     [facility].  Moreno-Escobar  Assistant Professor  Department of Neurological Sciences  [facility]       On the day of the encounter, a total of  48 minutes was spent on this patient encounter including review of historical information, examination, documentation and post-visit activities. The time documented excludes procedural time.

## 2021-09-06 ENCOUNTER — Encounter (INDEPENDENT_AMBULATORY_CARE_PROVIDER_SITE_OTHER): Payer: Self-pay | Admitting: Student in an Organized Health Care Education/Training Program

## 2021-10-06 ENCOUNTER — Inpatient Hospital Stay
Admission: RE | Admit: 2021-10-06 | Discharge: 2021-10-06 | Disposition: A | Discharge status: Home / Self Care | Payer: BC Managed Care – PPO | Source: Ambulatory Visit | Attending: Family | Admitting: Family

## 2021-10-06 ENCOUNTER — Other Ambulatory Visit: Payer: Self-pay

## 2021-10-06 DIAGNOSIS — K760 Fatty (change of) liver, not elsewhere classified: Secondary | ICD-10-CM | POA: Insufficient documentation

## 2021-10-06 DIAGNOSIS — R932 Abnormal findings on diagnostic imaging of liver and biliary tract: Secondary | ICD-10-CM

## 2021-10-07 ENCOUNTER — Other Ambulatory Visit: Payer: Self-pay

## 2021-10-07 ENCOUNTER — Observation Stay
Admission: EM | Admit: 2021-10-07 | Discharge: 2021-10-09 | Disposition: A | Discharge status: Home / Self Care | Payer: BC Managed Care – PPO | Attending: Student in an Organized Health Care Education/Training Program | Admitting: Student in an Organized Health Care Education/Training Program

## 2021-10-07 ENCOUNTER — Observation Stay (HOSPITAL_COMMUNITY): Payer: BC Managed Care – PPO | Admitting: Internal Medicine

## 2021-10-07 ENCOUNTER — Encounter (HOSPITAL_COMMUNITY): Payer: Self-pay

## 2021-10-07 DIAGNOSIS — R55 Syncope and collapse: Secondary | ICD-10-CM

## 2021-10-07 DIAGNOSIS — R197 Diarrhea, unspecified: Secondary | ICD-10-CM

## 2021-10-07 DIAGNOSIS — Z79899 Other long term (current) drug therapy: Secondary | ICD-10-CM | POA: Insufficient documentation

## 2021-10-07 DIAGNOSIS — E785 Hyperlipidemia, unspecified: Secondary | ICD-10-CM | POA: Insufficient documentation

## 2021-10-07 DIAGNOSIS — I10 Essential (primary) hypertension: Secondary | ICD-10-CM | POA: Insufficient documentation

## 2021-10-07 DIAGNOSIS — R531 Weakness: Secondary | ICD-10-CM

## 2021-10-07 DIAGNOSIS — K625 Hemorrhage of anus and rectum: Secondary | ICD-10-CM

## 2021-10-07 DIAGNOSIS — E119 Type 2 diabetes mellitus without complications: Secondary | ICD-10-CM | POA: Insufficient documentation

## 2021-10-07 DIAGNOSIS — K219 Gastro-esophageal reflux disease without esophagitis: Secondary | ICD-10-CM | POA: Insufficient documentation

## 2021-10-07 DIAGNOSIS — Z794 Long term (current) use of insulin: Secondary | ICD-10-CM | POA: Insufficient documentation

## 2021-10-07 DIAGNOSIS — F319 Bipolar disorder, unspecified: Secondary | ICD-10-CM | POA: Insufficient documentation

## 2021-10-07 DIAGNOSIS — R42 Dizziness and giddiness: Secondary | ICD-10-CM | POA: Insufficient documentation

## 2021-10-07 DIAGNOSIS — R251 Tremor, unspecified: Secondary | ICD-10-CM

## 2021-10-07 DIAGNOSIS — K921 Melena: Principal | ICD-10-CM | POA: Insufficient documentation

## 2021-10-07 DIAGNOSIS — R079 Chest pain, unspecified: Secondary | ICD-10-CM

## 2021-10-07 DIAGNOSIS — E039 Hypothyroidism, unspecified: Secondary | ICD-10-CM | POA: Insufficient documentation

## 2021-10-07 HISTORY — DX: Tremor, unspecified: R25.1

## 2021-10-07 HISTORY — DX: Gastro-esophageal reflux disease without esophagitis: K21.9

## 2021-10-07 HISTORY — DX: Bipolar disorder, unspecified: F31.9

## 2021-10-07 HISTORY — DX: Type 2 diabetes mellitus without complications: E11.9

## 2021-10-07 MED ORDER — SODIUM CHLORIDE 0.9 % IV BOLUS
1000.0000 mL | INJECTION | Status: AC
Start: 2021-10-08 — End: 2021-10-08
  Administered 2021-10-08: 0 mL via INTRAVENOUS
  Administered 2021-10-08: 1000 mL via INTRAVENOUS

## 2021-10-07 NOTE — ED Provider Notes (Signed)
Emergency Medicine    Name: Julie Cain  Age and Gender: 61 y.o. female  Date of Birth: 02-02-1961  MRN: [id number]  PCP: Aleene Davidson, PA-C    CC:  Chief Complaint   Patient presents with    Diarrhea    Dizziness    Fall       HPI:  Julie Cain is a 61 y.o. White female with history of diarrhea and blood in stool, with dizziness and near syncope.  She felt hot and cold.  Patient reports that for many years she has had  episodes of itching in her hands, then has profound diarrhea and dizziness. Today this happened at 2 pm. She felt weak and dizzy and had some blood in her stool which prompted her to come to the ED.    She did not have syncope or dyspnea. She had mild chest pain at one point.        Below pertinent information reviewed with patient:  Past Medical History:   Diagnosis Date    Bipolar disorder, unspecified (CMS HCC)     HTN (hypertension)     Hyperlipidemia     Hypothyroidism     Miscarriage     x3     Gastritis  Fatty liver  Colonoscopy less than five years ago      Allergies   Allergen Reactions    Metformin Nausea/ Vomiting       Past Surgical History:   Procedure Laterality Date    HX CATARACT REMOVAL      HX CHOLECYSTECTOMY      HX HERNIA REPAIR             Social History        Objective:    ED Triage Vitals [10/07/21 2236]   BP (Non-Invasive) 110/86   Heart Rate (!) 101   Respiratory Rate 20   Temperature 36.4 C (97.6 F)   SpO2 92 %   Weight 93 kg (205 lb)   Height 1.575 m (5\' 2" )     Filed Vitals:    10/08/21 0000 10/08/21 0023 10/08/21 0025 10/08/21 0100   BP: 117/77 124/79 112/81 114/89   Pulse:  85 100 92   Resp:    19   Temp:       SpO2: 90%   92%       Nursing notes and vital signs reviewed.    Constitutional - No acute distress.  Alert and Active.   HEENT - Normocephalic. Atraumatic. PERRL. EOMI. Conjunctiva clear. Oropharynx with no erythema, lesions, or exudates. Moist mucous membranes.   Neck - Trachea midline. No stridor. No hoarseness.  Cardiac - Regular rate and rhythm.  No murmurs, rubs, or gallops.  Respiratory - Clear to auscultation bilaterally. No rales, wheezes or rhonchi.  Abdomen - Non-tender, soft, non-distended. No rebound or guarding.   Rectal per nursing staff shows obviously bloody stool.  Musculoskeletal - Good AROM. No muscle or joint tenderness appreciated. No clubbing, cyanosis or edema.  Skin - Warm and dry, without any rashes or other lesions.  Neuro - Cranial nerves II-XII are grossly intact.  Moving all extremities symmetrically. Tremor.  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    MDM/ED Course:    EKG shows a sinus rhythm, rate of 100. Normal PR and QRS.  No STeMI.    Care discussed with Dr. 05-15-2006 who will admit the  patient for further evaluation given her obviously bloody stool.      Medical Decision Making  Amount and/or Complexity of Data Reviewed  Labs: ordered. Decision-making details documented in ED Course.  ECG/medicine tests: ordered. Decision-making details documented in ED Course.    Risk  Prescription drug management.  Decision regarding hospitalization.             Orders Placed This Encounter    OCCULT BLOOD (PRN ED USE ONLY)    COMPREHENSIVE METABOLIC PANEL, NON-FASTING    CBC/DIFF    CANCELED: OCCULT BLOOD, STOOL    TROPONIN NOW    TROPONIN IN ONE HOUR    TROPONIN IN THREE HOURS    CBC WITH DIFF    ECG 12 LEAD    TYPE AND SCREEN    NS bolus infusion 1,000 mL         Impression:   Clinical Impression   Diarrhea, unspecified type (Primary)   Dizziness   Near syncope   Rectal bleeding       Disposition: Admitted          Portions of this note may have been dictated using voice recognition software.     [name], MD  [facility] ED    -----------------------  Results for orders placed or performed during the hospital encounter of 10/07/21 (from the past 12 hour(s))   COMPREHENSIVE METABOLIC PANEL, NON-FASTING   Result Value Ref Range     SODIUM 137 136 - 145 mmol/L    POTASSIUM 4.1 3.5 - 5.1 mmol/L    CHLORIDE 105 98 - 107 mmol/L    CO2 TOTAL 22 21 - 31 mmol/L    ANION GAP 10 4 - 13 mmol/L    BUN 18 7 - 25 mg/dL    CREATININE 7.37 1.06 - 1.30 mg/dL    BUN/CREA RATIO 14 6 - 22    ESTIMATED GFR 49 (L) >59 mL/min/1.49m^2    ALBUMIN 3.7 3.5 - 5.7 g/dL    CALCIUM 8.7 8.6 - 26.9 mg/dL    GLUCOSE 485 (H) 74 - 109 mg/dL    ALKALINE PHOSPHATASE 69 34 - 104 U/L    ALT (SGPT) 20 7 - 52 U/L    AST (SGOT) 19 13 - 39 U/L    BILIRUBIN TOTAL 0.5 0.3 - 1.2 mg/dL    PROTEIN TOTAL 5.9 (L) 6.4 - 8.9 g/dL    ALBUMIN/GLOBULIN RATIO 1.7 (H) 0.8 - 1.4    OSMOLALITY, CALCULATED 281 270 - 290 mOsm/kg    CALCIUM, CORRECTED 9.0 8.9 - 10.8 mg/dL    GLOBULIN 2.2 (L) 2.9 - 5.4   TROPONIN NOW   Result Value Ref Range    TROPONIN I 3 <15 ng/L   CBC WITH DIFF   Result Value Ref Range    WBCS UNCORRECTED 8.1 x10^3/uL    WBC 8.1 4.0 - 10.5 x10^3/uL    RBC 4.57 4.20 - 5.40 x10^6/uL    HGB 13.1 12.5 - 16.0 g/dL    HCT 46.2 70.3 - 50.0 %    MCV 85.5 78.0 - 99.0 fL    MCH 28.6 27.0 - 32.0 pg    MCHC 33.5 32.0 - 36.0 g/dL    RDW 93.8 (H) 18.2 - 14.8 %    PLATELETS 205 140 - 440 x10^3/uL    MPV 6.5 (L) 7.4 - 10.4 fL    NEUTROPHIL % 74 40 - 76 %    LYMPHOCYTE % 15 (L) 25 - 45 %  MONOCYTE % 9 0 - 12 %    EOSINOPHIL % 2 0 - 7 %    BASOPHIL % 1 0 - 3 %    NEUTROPHIL # 6.10 1.80 - 8.40 x10^3/uL    LYMPHOCYTE # 1.20 1.10 - 5.00 x10^3/uL    MONOCYTE # 0.70 0.00 - 1.30 x10^3/uL    EOSINOPHIL # 0.10 0.00 - 0.80 x10^3/uL    BASOPHIL # 0.00 0.00 - 0.30 x10^3/uL   OCCULT BLOOD (PRN ED USE ONLY)    Specimen: Stool   Result Value Ref Range    OCCULT BLOOD Positive (A) Negative     No orders to display

## 2021-10-07 NOTE — ED Triage Notes (Signed)
"  I got really dizzy and fell", denies injury/striking head, diarrhea today, "I have these spells", "I feel better now, I'm just worried about the blood from the diarrhea"

## 2021-10-08 ENCOUNTER — Encounter (HOSPITAL_COMMUNITY): Payer: Self-pay | Admitting: Internal Medicine

## 2021-10-08 DIAGNOSIS — K219 Gastro-esophageal reflux disease without esophagitis: Secondary | ICD-10-CM

## 2021-10-08 DIAGNOSIS — I10 Essential (primary) hypertension: Secondary | ICD-10-CM

## 2021-10-08 DIAGNOSIS — R251 Tremor, unspecified: Secondary | ICD-10-CM

## 2021-10-08 DIAGNOSIS — E119 Type 2 diabetes mellitus without complications: Secondary | ICD-10-CM

## 2021-10-08 DIAGNOSIS — F319 Bipolar disorder, unspecified: Secondary | ICD-10-CM

## 2021-10-08 DIAGNOSIS — E039 Hypothyroidism, unspecified: Secondary | ICD-10-CM

## 2021-10-08 DIAGNOSIS — K921 Melena: Secondary | ICD-10-CM | POA: Diagnosis present

## 2021-10-08 LAB — TYPE AND SCREEN
ABO/RH(D): O POS
ANTIBODY SCREEN: NEGATIVE

## 2021-10-08 LAB — COMPREHENSIVE METABOLIC PANEL, NON-FASTING
ALBUMIN/GLOBULIN RATIO: 1.7 — ABNORMAL HIGH (ref 0.8–1.4)
ALBUMIN: 3.7 g/dL (ref 3.5–5.7)
ALKALINE PHOSPHATASE: 69 U/L (ref 34–104)
ALT (SGPT): 20 U/L (ref 7–52)
ANION GAP: 10 mmol/L (ref 4–13)
AST (SGOT): 19 U/L (ref 13–39)
BILIRUBIN TOTAL: 0.5 mg/dL (ref 0.3–1.2)
BUN/CREA RATIO: 14 (ref 6–22)
BUN: 18 mg/dL (ref 7–25)
CALCIUM, CORRECTED: 9 mg/dL (ref 8.9–10.8)
CALCIUM: 8.7 mg/dL (ref 8.6–10.3)
CHLORIDE: 105 mmol/L (ref 98–107)
CO2 TOTAL: 22 mmol/L (ref 21–31)
CREATININE: 1.25 mg/dL (ref 0.60–1.30)
ESTIMATED GFR: 49 mL/min/{1.73_m2} — ABNORMAL LOW (ref >59)
GLOBULIN: 2.2 — ABNORMAL LOW (ref 2.9–5.4)
GLUCOSE: 189 mg/dL — ABNORMAL HIGH (ref 74–109)
OSMOLALITY, CALCULATED: 281 mOsm/kg (ref 270–290)
POTASSIUM: 4.1 mmol/L (ref 3.5–5.1)
PROTEIN TOTAL: 5.9 g/dL — ABNORMAL LOW (ref 6.4–8.9)
SODIUM: 137 mmol/L (ref 136–145)

## 2021-10-08 LAB — CBC WITH DIFF
BASOPHIL #: 0 10*3/uL (ref 0.00–0.30)
BASOPHIL %: 1 % (ref 0–3)
EOSINOPHIL #: 0.1 10*3/uL (ref 0.00–0.80)
EOSINOPHIL %: 2 % (ref 0–7)
HCT: 39.1 % (ref 37.0–47.0)
HGB: 13.1 g/dL (ref 12.5–16.0)
LYMPHOCYTE #: 1.2 10*3/uL (ref 1.10–5.00)
LYMPHOCYTE %: 15 % — ABNORMAL LOW (ref 25–45)
MCH: 28.6 pg (ref 27.0–32.0)
MCHC: 33.5 g/dL (ref 32.0–36.0)
MCV: 85.5 fL (ref 78.0–99.0)
MONOCYTE #: 0.7 10*3/uL (ref 0.00–1.30)
MONOCYTE %: 9 % (ref 0–12)
MPV: 6.5 fL — ABNORMAL LOW (ref 7.4–10.4)
NEUTROPHIL #: 6.1 10*3/uL (ref 1.80–8.40)
NEUTROPHIL %: 74 % (ref 40–76)
PLATELETS: 205 10*3/uL (ref 140–440)
RBC: 4.57 10*6/uL (ref 4.20–5.40)
RDW: 15.8 % — ABNORMAL HIGH (ref 11.6–14.8)
WBC: 8.1 10*3/uL (ref 4.0–10.5)
WBCS UNCORRECTED: 8.1 10*3/uL

## 2021-10-08 LAB — POC BLOOD GLUCOSE (RESULTS)
GLUCOSE, POC: 156 mg/dl (ref 50–500)
GLUCOSE, POC: 128 mg/dl (ref 50–500)

## 2021-10-08 LAB — H & H
HCT: 37.1 % (ref 37.0–47.0)
HCT: 37.1 % (ref 37.0–47.0)
HGB: 12.7 g/dL (ref 12.5–16.0)
HGB: 12.4 g/dL — ABNORMAL LOW (ref 12.5–16.0)

## 2021-10-08 LAB — TROPONIN-I
TROPONIN I: 2 ng/L (ref <15)
TROPONIN I: 3 ng/L (ref <15)
TROPONIN I: 3 ng/L (ref <15)

## 2021-10-08 LAB — OCCULT BLOOD (PRN ED USE ONLY): OCCULT BLOOD: POSITIVE — AB

## 2021-10-08 MED ORDER — LEVOTHYROXINE 100 MCG TABLET
ORAL_TABLET | ORAL | Status: AC
Start: 2021-10-08 — End: 2021-10-08
  Filled 2021-10-08: qty 1

## 2021-10-08 MED ORDER — LAMOTRIGINE 100 MG TABLET
100.0000 mg | ORAL_TABLET | Freq: Two times a day (BID) | ORAL | Status: DC
Start: 2021-10-08 — End: 2021-10-09
  Administered 2021-10-08 – 2021-10-09 (×3): 100 mg via ORAL
  Filled 2021-10-08 (×6): qty 1

## 2021-10-08 MED ORDER — ATORVASTATIN 40 MG TABLET
40.0000 mg | ORAL_TABLET | Freq: Every evening | ORAL | Status: DC
Start: 2021-10-08 — End: 2021-10-09
  Administered 2021-10-08: 40 mg via ORAL
  Filled 2021-10-08: qty 1

## 2021-10-08 MED ORDER — PANTOPRAZOLE 40 MG TABLET,DELAYED RELEASE
40.0000 mg | DELAYED_RELEASE_TABLET | Freq: Every day | ORAL | Status: DC
Start: 2021-10-08 — End: 2021-10-09
  Administered 2021-10-08 – 2021-10-09 (×2): 40 mg via ORAL
  Filled 2021-10-08 (×2): qty 1

## 2021-10-08 MED ORDER — LORAZEPAM 2 MG/ML INJECTION WRAPPER
1.0000 mg | INTRAMUSCULAR | Status: DC
Start: 2021-10-08 — End: 2021-10-08

## 2021-10-08 MED ORDER — RISPERIDONE 2 MG TABLET
2.5000 mg | ORAL_TABLET | Freq: Every evening | ORAL | Status: DC
Start: 2021-10-08 — End: 2021-10-09
  Administered 2021-10-08: 2.5 mg via ORAL
  Filled 2021-10-08 (×2): qty 1

## 2021-10-08 MED ORDER — LEVOTHYROXINE 50 MCG TABLET
ORAL_TABLET | ORAL | Status: AC
Start: 2021-10-08 — End: 2021-10-08
  Filled 2021-10-08: qty 1

## 2021-10-08 MED ORDER — ACETAMINOPHEN 325 MG TABLET
650.0000 mg | ORAL_TABLET | Freq: Four times a day (QID) | ORAL | Status: DC | PRN
Start: 2021-10-08 — End: 2021-10-09

## 2021-10-08 MED ORDER — SODIUM CHLORIDE 0.9 % (FLUSH) INJECTION SYRINGE
3.0000 mL | INJECTION | INTRAMUSCULAR | Status: DC | PRN
Start: 2021-10-08 — End: 2021-10-09

## 2021-10-08 MED ORDER — SODIUM CHLORIDE 0.9 % (FLUSH) INJECTION SYRINGE
3.0000 mL | INJECTION | Freq: Three times a day (TID) | INTRAMUSCULAR | Status: DC
Start: 2021-10-08 — End: 2021-10-09
  Administered 2021-10-08: 3 mL
  Administered 2021-10-08: 0 mL
  Administered 2021-10-08 – 2021-10-09 (×3): 3 mL

## 2021-10-08 MED ORDER — DEXTROSE 50 % IN WATER (D50W) INTRAVENOUS SYRINGE
12.5000 g | INJECTION | INTRAVENOUS | Status: DC | PRN
Start: 2021-10-08 — End: 2021-10-09

## 2021-10-08 MED ORDER — METOPROLOL SUCCINATE ER 25 MG TABLET,EXTENDED RELEASE 24 HR
50.0000 mg | ORAL_TABLET | Freq: Every day | ORAL | Status: DC
Start: 2021-10-08 — End: 2021-10-09
  Administered 2021-10-08 – 2021-10-09 (×2): 50 mg via ORAL
  Filled 2021-10-08 (×2): qty 2

## 2021-10-08 MED ORDER — AMLODIPINE 5 MG TABLET
5.0000 mg | ORAL_TABLET | Freq: Every day | ORAL | Status: DC
Start: 2021-10-08 — End: 2021-10-09
  Administered 2021-10-08 – 2021-10-09 (×2): 5 mg via ORAL
  Filled 2021-10-08 (×2): qty 1

## 2021-10-08 MED ORDER — DEXTROSE 50 % IN WATER (D50W) INTRAVENOUS SYRINGE
25.0000 g | INJECTION | INTRAVENOUS | Status: DC | PRN
Start: 2021-10-08 — End: 2021-10-09

## 2021-10-08 MED ORDER — LEVOTHYROXINE 25 MCG TABLET
112.5000 ug | ORAL_TABLET | Freq: Every morning | ORAL | Status: DC
Start: 2021-10-08 — End: 2021-10-09
  Administered 2021-10-08 – 2021-10-09 (×2): 112.5 ug via ORAL
  Filled 2021-10-08 (×4): qty 0.5

## 2021-10-08 MED ORDER — INSULIN REGULAR HUMAN 100 UNIT/ML INJECTION SSIP
0.0000 [IU] | INJECTION | Freq: Four times a day (QID) | SUBCUTANEOUS | Status: DC | PRN
Start: 2021-10-08 — End: 2021-10-09
  Administered 2021-10-08: 2 [IU] via SUBCUTANEOUS
  Administered 2021-10-09: 4 [IU] via SUBCUTANEOUS
  Filled 2021-10-08 (×2): qty 6
  Filled 2021-10-08: qty 12

## 2021-10-08 NOTE — Nurses Notes (Signed)
Home medications sent to security and locked up.

## 2021-10-08 NOTE — Progress Notes (Signed)
Ms. Grime was seen and examined at the bedside.  Admitted early this AM with lower GI bleed symptoms.  Hemoglobin is stable at 12.4.  No additional episodes of bleeding.  Will await GI recommendations.

## 2021-10-08 NOTE — ED Nurses Note (Signed)
Report called to Lisa on 3W at this time.

## 2021-10-08 NOTE — Consults (Signed)
[facility]  Gastroenterology/ Hepatology Consult Note    Date of Service:  10/08/2021  Julie Cain   61 y.o. female  Date of Admission:  10/07/2021  Date of Birth:  1960/03/22  0     Reason for Consultation:  Rectal bleeding          History of Present Illness:  Julie Cain is a 61 y.o. White female who came to ER because she had rectal bleeding at home. Reports she has intermittent, infrequent episodes of her hands itching, followed by nausea and feeling very hot and then will feel lightheaded at times. States she had a BM where she had to strain just a bit and then followed by initially normal BM, loose and then progressed to diarrhea. She had several BM's but not sure how many and then she noticed bright red blood so she came to ER. She states abdomen feels sore but denies pain. Denies nausea or vomiting. Denies heartburn or acid reflux. No BM or rectal bleeding today.   She had positive stool for OB.  States Dr Allena Katz did a colonoscopy on her a year ago and she had one polyp removed and internal hemorrhoids.       History:    Past Medical:    Past Medical History:   Diagnosis Date    Bipolar disorder (CMS HCC)     Bipolar disorder, unspecified (CMS HCC)     GERD (gastroesophageal reflux disease)     HTN (hypertension)     Hyperlipidemia     Hypothyroidism     Miscarriage     x3    Tremor     Type 2 diabetes mellitus (CMS HCC)       Past Surgical:    Past Surgical History:   Procedure Laterality Date    COLONOSCOPY      ESOPHAGOGASTRODUODENOSCOPY      HX CATARACT REMOVAL      HX CHOLECYSTECTOMY      HX HERNIA REPAIR        Family:    Family Medical History:    None        Social:   reports that she has never smoked. She has never used smokeless tobacco. She reports that she does not drink alcohol and does not use drugs.     REVIEW OF SYSTEMS:  Review of Systems   Constitutional: Negative for fever.   Gastrointestinal:  Positive for hematochezia. Negative for abdominal pain,  constipation, heartburn, melena, nausea and vomiting.      Allergies   Allergen Reactions    Metformin Nausea/ Vomiting       Medications:  Medications Prior to Admission       Prescriptions    amLODIPine (NORVASC) 5 mg Oral Tablet    Take 1 Tablet (5 mg total) by mouth Once a day    atorvastatin (LIPITOR) 40 mg Oral Tablet    Take 1 Tablet (40 mg total) by mouth Every night    cholecalciferol, vitamin D3, 250 mcg (10,000 unit) Oral Capsule    Take 4 Capsules (40,000 Units total) by mouth Every 3 days Unsure which days she takes this    HUMALOG [facility] INSULIN 100 unit/mL Subcutaneous Insulin Pen    10 Units Three times daily with meals    lamoTRIgine (LAMICTAL) 100 mg Oral Tablet    Take 1 Tablet (100 mg total) by mouth Twice daily    levothyroxine (SYNTHROID) 112 mcg Oral Tablet    1  Tablet (112 mcg total) Every morning    metoprolol succinate (TOPROL-XL) 50 mg Oral Tablet Sustained Release 24 hr    1.5 Tablets (75 mg total) Twice daily    nitroGLYCERIN (NITROSTAT) 0.4 mg Sublingual Tablet, Sublingual    1 Tablet (0.4 mg total) Every 5 minutes as needed    omeprazole (PRILOSEC) 40 mg Oral Capsule, Delayed Release(E.C.)    1 Capsule (40 mg total) Once a day    ONETOUCH VERIO TEST STRIPS Strip    USE TO TEST BLOOD SUGAR TWICE DAILY    risperiDONE (RISPERDAL) 0.5 mg Oral Tablet    Take 1 Tablet (0.5 mg total) by mouth Every night    risperiDONE (RISPERDAL) 2 mg Oral Tablet    Take 1.5 Tablets (3 mg total) by mouth Every night    semaglutide (OZEMPIC) 0.25 mg or 0.5 mg (2 mg/3 mL) Subcutaneous Pen Injector    Inject 0.5 mg under the skin Every 7 days Tuesdays    SEMGLEE,INSULIN GLARG-YFGN,PEN 100 unit/mL (3 mL) Subcutaneous Insulin Pen    30 Units Twice daily          acetaminophen (TYLENOL) tablet, 650 mg, Oral, Q6H PRN  amLODIPine (NORVASC) tablet, 5 mg, Oral, Daily  atorvastatin (LIPITOR) tablet, 40 mg, Oral, NIGHTLY  dextrose 50% (0.5 g/mL) injection - syringe, 25 g, Intravenous, Q15 Min PRN   Or  dextrose 50%  (0.5 g/mL) injection - syringe, 12.5 g, Intravenous, Q15 Min PRN  lamoTRIgine (LAMICTAL) tablet, 100 mg, Oral, 2x/day  levothyroxine (SYNTHROID) tablet 112.5 mcg, 112.5 mcg, Oral, QAM  metoprolol succinate (TOPROL-XL) 24 hr extended release tablet, 50 mg, Oral, Daily  NS flush syringe, 3 mL, Intracatheter, Q8HRS  NS flush syringe, 3 mL, Intracatheter, Q1H PRN  pantoprazole (PROTONIX) delayed release tablet, 40 mg, Oral, Daily  risperiDONE (RISPERDAL) tablet 2.5 mg, 2.5 mg, Oral, NIGHTLY  SSIP insulin R human (HUMULIN R) 100 units/mL injection, 0-12 Units, Subcutaneous, Q6H PRN        Physical Exam:    Vitals:  Temperature: 36.4 C (97.6 F)  Heart Rate: 94  Respiratory Rate: 18  BP (Non-Invasive): 121/86  SpO2: 91 %    Exam:  Nursing note and vitals reviewed.   Objective:  General Appearance:  Comfortable, well-appearing and in no acute distress.    Vital signs: (most recent): Blood pressure 121/86, pulse 94, temperature 36.4 C (97.6 F), resp. rate 18, height 1.575 m (5\' 2" ), weight 93.7 kg (206 lb 8 oz), SpO2 91 %.  Vital signs are normal.  No fever.    Lungs:  Normal effort and normal respiratory rate.  Breath sounds clear to auscultation.    Heart: Normal rate.  Regular rhythm.  S1 normal and S2 normal.    Abdomen: Abdomen is soft.  Bowel sounds are normal.   There is no abdominal tenderness.     Extremities: Normal range of motion.    Pulses: Distal pulses are intact.    Neurological: Patient is alert and oriented to person, place and time.    Skin:  Warm and dry.      Labs:     Results for orders placed or performed during the hospital encounter of 10/07/21 (from the past 24 hour(s))   COMPREHENSIVE METABOLIC PANEL, NON-FASTING   Result Value Ref Range    SODIUM 137 136 - 145 mmol/L    POTASSIUM 4.1 3.5 - 5.1 mmol/L    CHLORIDE 105 98 - 107 mmol/L    CO2 TOTAL 22 21 - 31  mmol/L    ANION GAP 10 4 - 13 mmol/L    BUN 18 7 - 25 mg/dL    CREATININE 3.21 2.24 - 1.30 mg/dL    BUN/CREA RATIO 14 6 - 22    ESTIMATED  GFR 49 (L) >59 mL/min/1.21m^2    ALBUMIN 3.7 3.5 - 5.7 g/dL    CALCIUM 8.7 8.6 - 82.5 mg/dL    GLUCOSE 003 (H) 74 - 109 mg/dL    ALKALINE PHOSPHATASE 69 34 - 104 U/L    ALT (SGPT) 20 7 - 52 U/L    AST (SGOT) 19 13 - 39 U/L    BILIRUBIN TOTAL 0.5 0.3 - 1.2 mg/dL    PROTEIN TOTAL 5.9 (L) 6.4 - 8.9 g/dL    ALBUMIN/GLOBULIN RATIO 1.7 (H) 0.8 - 1.4    OSMOLALITY, CALCULATED 281 270 - 290 mOsm/kg    CALCIUM, CORRECTED 9.0 8.9 - 10.8 mg/dL    GLOBULIN 2.2 (L) 2.9 - 5.4   TROPONIN NOW   Result Value Ref Range    TROPONIN I 3 <15 ng/L   CBC WITH DIFF   Result Value Ref Range    WBCS UNCORRECTED 8.1 x10^3/uL    WBC 8.1 4.0 - 10.5 x10^3/uL    RBC 4.57 4.20 - 5.40 x10^6/uL    HGB 13.1 12.5 - 16.0 g/dL    HCT 70.4 88.8 - 91.6 %    MCV 85.5 78.0 - 99.0 fL    MCH 28.6 27.0 - 32.0 pg    MCHC 33.5 32.0 - 36.0 g/dL    RDW 94.5 (H) 03.8 - 14.8 %    PLATELETS 205 140 - 440 x10^3/uL    MPV 6.5 (L) 7.4 - 10.4 fL    NEUTROPHIL % 74 40 - 76 %    LYMPHOCYTE % 15 (L) 25 - 45 %    MONOCYTE % 9 0 - 12 %    EOSINOPHIL % 2 0 - 7 %    BASOPHIL % 1 0 - 3 %    NEUTROPHIL # 6.10 1.80 - 8.40 x10^3/uL    LYMPHOCYTE # 1.20 1.10 - 5.00 x10^3/uL    MONOCYTE # 0.70 0.00 - 1.30 x10^3/uL    EOSINOPHIL # 0.10 0.00 - 0.80 x10^3/uL    BASOPHIL # 0.00 0.00 - 0.30 x10^3/uL   OCCULT BLOOD (PRN ED USE ONLY)    Specimen: Stool   Result Value Ref Range    OCCULT BLOOD Positive (A) Negative   ECG 12 LEAD   Result Value Ref Range    Ventricular rate 90 BPM    Atrial Rate 90 BPM    PR Interval 168 ms    QRS Duration 82 ms    QT Interval 272 ms    QTC Calculation 332 ms    Calculated P Axis 28 degrees    Calculated R Axis -5 degrees    Calculated T Axis 54 degrees   TROPONIN IN ONE HOUR   Result Value Ref Range    TROPONIN I 2 <15 ng/L   TYPE AND SCREEN   Result Value Ref Range    UNITS ORDERED NOT STATED     ABO/RH(D) O POSITIVE     ANTIBODY SCREEN NEGATIVE     SPECIMEN EXPIRATION DATE 10/11/2021,2359    TROPONIN IN THREE HOURS   Result Value Ref Range    TROPONIN I 3  <15 ng/L   H & H   Result Value Ref Range    HGB 12.7 12.5 - 16.0  g/dL    HCT 62.9 47.6 - 54.6 %   H & H   Result Value Ref Range    HGB 12.4 (L) 12.5 - 16.0 g/dL    HCT 50.3 54.6 - 56.8 %   POC BLOOD GLUCOSE (RESULTS)   Result Value Ref Range    GLUCOSE, POC 156 50 - 500 mg/dl       Imaging Studies:          Assessment/Plan:     Problem List:  Active Hospital Problems   (*Primary Problem)    Diagnosis    *Bloody stools    HTN (hypertension)    Type 2 diabetes mellitus (CMS HCC)    Hypothyroidism    GERD (gastroesophageal reflux disease)    Bipolar disorder (CMS HCC)    Tremor     Agree with current management and supportive care. Diarrhea and rectal bleeding has resolved. Stool for OB positive. Hgb stable and normal at 13.1. She had colonoscopy in the office a year ago and had one polyp removed and had internal hemorrhoids. Rectal bleeding likely hemorrhoidal and has since stopped. Observe from GI standpoint. She can FU in office. Will continue to follow. Further recommendations based on clinical course. Patient has been discussed in detail with Dr Concha Se and treatment plan decided by him. Thank you for this consultation.   Norina Buzzard [place], [place]

## 2021-10-08 NOTE — H&P (Signed)
[facility]  History and Physical    Date of Service:  10/08/2021  Julie Cain, 61 y.o. female  Encounter Start Date:  10/07/2021  Inpatient Admission Date:   Date of Birth:  [birth year]  PCP: Aleene Davidson, PA-C       Chief Complaint:  bloody stools    HPI: Julie Cain is a 60 y.o., White female who presented to the emergency department today with a chief complaint of bloody stools.  The patient was seen examined at bedside in the emergency department following discussion with the emergency room provider.  Patient does have relative present during the encounter.  Patient tells me that today she had an episode where she had initially pruritus of both of her hands, denied any color changes to her hands, she did have associated nausea with this, said she did not vomit she felt very nauseous and very "hot".  She said followed by this she began having bowel movements, she said initially she had to strain a bit, she said this progressed to diarrhea from formed stool.  Patient does tell me that she has had infrequent episodes similar to this in the past.  Patient reports that her blood she passed was bright red.  Patient tells me she did not syncopized today however she felt very lightheaded.  Patient secondary to the bright red blood does come to the emergency department for further evaluation.  Patient says he does follow with Dr. Concha Se, she thinks she had colonoscopy approximately 1 year ago.  Patient tells me she was told that she has internal hemorrhoids.  She denies having any abdominal pain.    Past Medical History:    Past Medical History:   Diagnosis Date    Bipolar disorder (CMS HCC)     Bipolar disorder, unspecified (CMS HCC)     GERD (gastroesophageal reflux disease)     HTN (hypertension)     Hyperlipidemia     Hypothyroidism     Miscarriage     x3    Tremor     Type 2 diabetes mellitus (CMS HCC)              Medications Prior to Admission       Prescriptions    amLODIPine (NORVASC)  5 mg Oral Tablet    Take 1 Tablet (5 mg total) by mouth Once a day    atorvastatin (LIPITOR) 40 mg Oral Tablet    Take 1 Tablet (40 mg total) by mouth Every night    HUMALOG [facility] INSULIN 100 unit/mL Subcutaneous Insulin Pen    lamoTRIgine (LAMICTAL) 100 mg Oral Tablet    Take 1 Tablet (100 mg total) by mouth Twice daily    levothyroxine (SYNTHROID) 112 mcg Oral Tablet    metoprolol succinate (TOPROL-XL) 50 mg Oral Tablet Sustained Release 24 hr    nitroGLYCERIN (NITROSTAT) 0.4 mg Sublingual Tablet, Sublingual    omeprazole (PRILOSEC) 40 mg Oral Capsule, Delayed Release(E.C.)    TAKE 1 CAPSULE BY MOUTH DAILY BEFORE AND DINNER    ONETOUCH VERIO TEST STRIPS Strip    USE TO TEST BLOOD SUGAR TWICE DAILY    risperiDONE (RISPERDAL) 0.5 mg Oral Tablet    Take 1 Tablet (0.5 mg total) by mouth Every night    risperiDONE (RISPERDAL) 2 mg Oral Tablet    Take 1 Tablet (2 mg total) by mouth Every night    SEMGLEE,INSULIN GLARG-YFGN,PEN 100 unit/mL (3 mL) Subcutaneous Insulin Pen  Allergies   Allergen Reactions    Metformin Nausea/ Vomiting       Past Surgical History:  Past Surgical History:   Procedure Laterality Date    COLONOSCOPY      ESOPHAGOGASTRODUODENOSCOPY      HX CATARACT REMOVAL      HX CHOLECYSTECTOMY      HX HERNIA REPAIR             Family History:  Family Medical History:    None            Social History:  Social History     Tobacco Use    Smoking status: Never    Smokeless tobacco: Never   Substance Use Topics    Alcohol use: Never    Drug use: Never        Review of Systems:  All systems are reviewed and are negative except those mentioned in the HPI portion    Examination:  BP (!) 123/93   Pulse 90   Temp 36.6 C (97.8 F)   Resp 18   Ht 1.575 m (5\' 2" )   Wt 93 kg (205 lb)   SpO2 94%   BMI 37.49 kg/m       General: Patient is alert and oriented to person, place and time.    HEENT: Pupils are of round shape, equal in size, and reactive to light bilaterally.    Heart: S1 and S2 are  present.    Lungs: Breath sounds are appreciated at all posterior lung fields, no appreciable crackles, wheezes, or rhonchi.    Gastrointestinal: Bowel sounds are appreciated at all 4 quadrants. Abdomen is soft, not appreciably distended, non-tender to palpation at all quadrants.    Extremities: Radial pulses are 2/4 bilaterally, dorsalis pedis pulses are 2/4 bilaterally. Capillary refill is less than 3 seconds at distal digits bilaterally.    Genitourinary: No appreciable suprapubic tenderness.    Neurologic: Follows commands appropriately. No appreciable facial droop. Patient does have tremor at baseline.    Skin: Grossly intact at observable areas.    Labs:    Lab Results Today:    Results for orders placed or performed during the hospital encounter of 10/07/21 (from the past 24 hour(s))   COMPREHENSIVE METABOLIC PANEL, NON-FASTING   Result Value Ref Range    SODIUM 137 136 - 145 mmol/L    POTASSIUM 4.1 3.5 - 5.1 mmol/L    CHLORIDE 105 98 - 107 mmol/L    CO2 TOTAL 22 21 - 31 mmol/L    ANION GAP 10 4 - 13 mmol/L    BUN 18 7 - 25 mg/dL    CREATININE 12/07/21 4.76 - 1.30 mg/dL    BUN/CREA RATIO 14 6 - 22    ESTIMATED GFR 49 (L) >59 mL/min/1.38m^2    ALBUMIN 3.7 3.5 - 5.7 g/dL    CALCIUM 8.7 8.6 - 75m mg/dL    GLUCOSE 50.3 (H) 74 - 109 mg/dL    ALKALINE PHOSPHATASE 69 34 - 104 U/L    ALT (SGPT) 20 7 - 52 U/L    AST (SGOT) 19 13 - 39 U/L    BILIRUBIN TOTAL 0.5 0.3 - 1.2 mg/dL    PROTEIN TOTAL 5.9 (L) 6.4 - 8.9 g/dL    ALBUMIN/GLOBULIN RATIO 1.7 (H) 0.8 - 1.4    OSMOLALITY, CALCULATED 281 270 - 290 mOsm/kg    CALCIUM, CORRECTED 9.0 8.9 - 10.8 mg/dL    GLOBULIN 2.2 (L) 2.9 - 5.4   TROPONIN NOW  Result Value Ref Range    TROPONIN I 3 <15 ng/L   CBC WITH DIFF   Result Value Ref Range    WBCS UNCORRECTED 8.1 x10^3/uL    WBC 8.1 4.0 - 10.5 x10^3/uL    RBC 4.57 4.20 - 5.40 x10^6/uL    HGB 13.1 12.5 - 16.0 g/dL    HCT 92.4 46.2 - 86.3 %    MCV 85.5 78.0 - 99.0 fL    MCH 28.6 27.0 - 32.0 pg    MCHC 33.5 32.0 - 36.0 g/dL    RDW  81.7 (H) 71.1 - 14.8 %    PLATELETS 205 140 - 440 x10^3/uL    MPV 6.5 (L) 7.4 - 10.4 fL    NEUTROPHIL % 74 40 - 76 %    LYMPHOCYTE % 15 (L) 25 - 45 %    MONOCYTE % 9 0 - 12 %    EOSINOPHIL % 2 0 - 7 %    BASOPHIL % 1 0 - 3 %    NEUTROPHIL # 6.10 1.80 - 8.40 x10^3/uL    LYMPHOCYTE # 1.20 1.10 - 5.00 x10^3/uL    MONOCYTE # 0.70 0.00 - 1.30 x10^3/uL    EOSINOPHIL # 0.10 0.00 - 0.80 x10^3/uL    BASOPHIL # 0.00 0.00 - 0.30 x10^3/uL   OCCULT BLOOD (PRN ED USE ONLY)    Specimen: Stool   Result Value Ref Range    OCCULT BLOOD Positive (A) Negative   ECG 12 LEAD   Result Value Ref Range    Ventricular rate 90 BPM    Atrial Rate 90 BPM    PR Interval 168 ms    QRS Duration 82 ms    QT Interval 272 ms    QTC Calculation 332 ms    Calculated P Axis 28 degrees    Calculated R Axis -5 degrees    Calculated T Axis 54 degrees   TROPONIN IN ONE HOUR   Result Value Ref Range    TROPONIN I 2 <15 ng/L   TYPE AND SCREEN   Result Value Ref Range    UNITS ORDERED NOT STATED     ABO/RH(D) O POSITIVE     ANTIBODY SCREEN NEGATIVE     SPECIMEN EXPIRATION DATE 10/11/2021,2359        Assessment/Plan:   Active Hospital Problems    Diagnosis    Primary Problem: Bloody stools    HTN (hypertension)    Type 2 diabetes mellitus (CMS HCC)    Hypothyroidism    GERD (gastroesophageal reflux disease)    Bipolar disorder (CMS HCC)    Tremor     Patient presenting today with a chief complaint of bloody stools at home, patient does report history of internal hemorrhoids, last colonoscopy was approximately 1 year ago, follows with Dr. Concha Se, will consult him and appreciate his assistance, patient to be on clear liquids for now, speaking with nursing staff, fecal occult blood test was positive and was bright red in color.  Patient will have stat hemoglobin hematocrit now, we will check another level later this morning as well as follow the trend of this.  Patient denies any abdominal pain or abdominal discomfort, she says her nausea is completely resolved  at this time.  Regarding her lightheadedness and dizziness, she said that she did not pass out but felt close to this, reports infrequent episodes of essentially the exact symptoms she has today in the past, suspect vagal stimulation is a cause of the patient's  lightheadedness, hemoglobin hematocrit are appropriate on initial laboratory studies.  Her plan of care discussed with her under relative at bedside, all questions were answered.  Further changes pending her clinical course.    Medical decision-making for today's visit is of low level.    DVT/PE Prophylaxis: Ted hose    Alvina Chou, DO    Contents of the document, in whole or in part, are completed utilizing M*Modal dictation technology, please forgive any typographical errors that may exist.

## 2021-10-08 NOTE — ED Nurses Note (Signed)
In with pt at this time, no acute distress noted. Pt denying any wants or needs. Will continue to monitor.

## 2021-10-08 NOTE — Nurses Notes (Signed)
Patient states she does not normally wear oxygen at home. Oxygen on at [facility] with saturation 94%. Oxygen removed earlier this am and saturations remain above 90%. Patient oxygen now ringing out at 86%. This nurse enters room and finds patient asleep. Oxygen at [facility] replaced and saturations return above 90%.

## 2021-10-08 NOTE — ED Nurses Note (Signed)
Pt placed on 2L via [place] per provider order.

## 2021-10-09 DIAGNOSIS — I10 Essential (primary) hypertension: Secondary | ICD-10-CM

## 2021-10-09 LAB — ECG 12 LEAD
Atrial Rate: 90 {beats}/min
Calculated P Axis: 28 degrees
Calculated R Axis: -5 degrees
Calculated T Axis: 54 degrees
PR Interval: 168 ms
QRS Duration: 82 ms
QT Interval: 272 ms
QTC Calculation: 332 ms
Ventricular rate: 90 {beats}/min

## 2021-10-09 LAB — POC BLOOD GLUCOSE (RESULTS)
GLUCOSE, POC: 175 mg/dl (ref 50–500)
GLUCOSE, POC: 177 mg/dl (ref 50–500)
GLUCOSE, POC: 217 mg/dl (ref 50–500)
GLUCOSE, POC: 165 mg/dl (ref 50–500)

## 2021-10-09 NOTE — Discharge Summary (Signed)
Julie Cain     DISCHARGE SUMMARY      PATIENT NAME:  Julie Cain   MRN:  [id number]  DOB:  1960/02/21    INPATIENT ADMISSION DATE: 10/07/2021   DATE OF DISCHARGE:  10/09/21     ATTENDING PHYSICIAN: Nira Conn Bobbie Valletta, FNP-BC     PRIMARY CARE PHYSICIAN: Eulogio Ditch, PA-C     HOSPITAL PRESENTATION:  Intermittent bloody stools    Please see full admission H&P for details.      As per HPI:  HPI from 10/07/2021:Julie Cain is a 61 y.o., White female who presented to the emergency department today with a chief complaint of bloody stools.  The patient was seen examined at bedside in the emergency department following discussion with the emergency room provider.  Patient does have relative present during the encounter.  Patient tells me that today she had an episode where she had initially pruritus of both of her hands, denied any color changes to her hands, she did have associated nausea with this, said she did not vomit she felt very nauseous and very "hot".  She said followed by this she began having bowel movements, she said initially she had to strain a bit, she said this progressed to diarrhea from formed stool.  Patient does tell me that she has had infrequent episodes similar to this in the past.  Patient reports that her blood she passed was bright red.  Patient tells me she did not syncopized today however she felt very lightheaded.  Patient secondary to the bright red blood does come to the emergency department for further evaluation.  Patient says he does follow with Dr. Keith Cain, she thinks she had colonoscopy approximately 1 year ago.  Patient tells me she was told that she has internal hemorrhoids.  She denies having any abdominal pain  .   Patient did not have any further bleeding.  Hemoglobin remained stable at 12.4.  Did have a history of hemorrhoids in the past.  Was seen by GI, recommended follow-up on an outpatient basis.  Patient is agreeable with plan of care.  She is  tolerated a soft diet.  She reports some right-sided soreness, states that she feels it is more from positioning.  Denies any diarrhea or vomiting.  Afebrile.  Patient is cleared for discharge.    [name] did not have any further bleeding.  Hemoglobin remained stable at 12.4.  Did have a history of hemorrhoids in the past.  Was seen by GI, recommended follow-up on an outpatient basis.  Patient is agreeable with plan of care.  She is tolerated a soft diet.  She reports some right-sided soreness, states that she feels it is more from positioning.  Denies any diarrhea or vomiting.  Afebrile.  Patient is cleared for discharge          Problem List:  Active Hospital Problems   (*Primary Problem)    Diagnosis    *Bloody stools    HTN (hypertension)    Type 2 diabetes mellitus (CMS HCC)    Hypothyroidism    GERD (gastroesophageal reflux disease)    Bipolar disorder (CMS HCC)    Tremor               PHYSICAL EXAM   DAY OF DISCHARGE:    BP (!) 124/55   Pulse 88   Temp 36.5 C (97.7 F)   Resp 18   Ht 1.575 m (5' 2")  Wt 93.7 kg (206 lb 8 oz)   SpO2 94%   BMI 37.77 kg/m          General:  Patient is resting in bed, no acute distress, alert and oriented x3   Eyes:  PERRL, no scleral icterus   HENT:  Normocephalic, atraumatic, oral mucosa is moist and pink, no nasal discharge   Heart:  RRR, S1 and S2 auscultated, no murmurs appreciated   Lungs:  Unlabored respirations.  Lungs are clear to auscultation bilaterally, no wheezes, no rales, O2 2 L nasal cannula  Abdomen:  Soft, active bowel sounds, non-tender to palpation, non-distended  Extremities:  Pulses equal in all extremities bilaterally.  Capillary refill less than 3 seconds.  No edema in lower extremities bilaterally   Skin:  Warm and dry.  Not diaphoretic  Neuro:  A&O x 3.  No focal deficits.  Speech intact.  Not tremulous  Psych:  Cooperative, not agitated    LABS:  CBC with Diff (Last 48 Hours):    Recent Results  in last 48 hours     10/08/21  0012 10/08/21  0322 10/08/21  0900   WBC 8.1  --   --    HGB 13.1 12.7 12.4*   HCT 39.1 37.1 37.1   MCV 85.5  --   --    PLTCNT 205  --   --    PMNS 74  --   --    LYMPHOCYTES 15*  --   --    MONOCYTES 9  --   --    EOSINOPHIL 2  --   --    BASOPHILS 1  0.00  --   --           Last BMP  (Last result in the past 48 hours)        Na   K   Cl   CO2   BUN   Cr   Calcium   Glucose   Glucose-Fasting        10/08/21 0012 137   4.1   105   22   18   1.25   8.7   189               Last Hepatic Panel  (Last result in the past 48 hours)        Albumin   Total PTN   Total Bili   Direct Bili   Ast/SGOT   Alt/SGPT   Alk Phos        10/08/21 0012 3.7   5.9   0._0 69                BMP (Last 48 Hours):    Recent Results in last 48 hours     10/08/21  0012   SODIUM 137   POTASSIUM 4.1   CHLORIDE 105   CO2 22   BUN 18   CREATININE 1.25   CALCIUM 8.7   GLUCOSENF 189*          DISCHARGE MEDICATIONS:     Current Discharge Medication List        CONTINUE these medications - NO CHANGES were made during your visit.        Details   amLODIPine 5 mg Tablet  Commonly known as: NORVASC   5 mg, Oral, DAILY  Refills: 0     atorvastatin 40 mg Tablet  Commonly known as: LIPITOR  40 mg, Oral, NIGHTLY  Refills: 0     cholecalciferol (vitamin D3) 250 mcg (10,000 unit) Capsule   40,000 Units, Oral, EVERY 3 DAYS, Unsure which days she takes this   Refills: 0     HumaLOG KwikPen Insulin 100 unit/mL Insulin Pen  Generic drug: insulin lispro   10 Units Three times daily with meals  Refills: 0     lamoTRIgine 100 mg Tablet  Commonly known as: LAMICTAL   100 mg, Oral, 2 TIMES DAILY  Refills: 0     levothyroxine 112 mcg Tablet  Commonly known as: SYNTHROID   1 Tablet (112 mcg total) Every morning  Refills: 0     metoprolol succinate 50 mg Tablet Sustained Release 24 hr  Commonly known as: TOPROL-XL   1.5 Tablets (75 mg total) Twice daily  Refills: 0     nitroGLYCERIN 0.4 mg Tablet, Sublingual  Commonly known as:  NITROSTAT   1 Tablet (0.4 mg total) Every 5 minutes as needed  Refills: 0     omeprazole 40 mg Capsule, Delayed Release(E.C.)  Commonly known as: PRILOSEC   1 Capsule (40 mg total) Once a day  Refills: 0     OneTouch Verio test strips Strip  Generic drug: Blood Sugar Diagnostic   USE TO TEST BLOOD SUGAR TWICE DAILY  Refills: 0     Ozempic 0.25 mg or 0.5 mg (2 mg/3 mL) Pen Injector  Generic drug: semaglutide   0.5 mg, Subcutaneous, EVERY 7 DAYS, Tuesdays   Refills: 0     * risperiDONE 0.5 mg Tablet  Commonly known as: RISPERDAL   0.5 mg, Oral, NIGHTLY  Refills: 0     * risperiDONE 2 mg Tablet  Commonly known as: RISPERDAL   3 mg, Oral, NIGHTLY  Refills: 0     Semglee(insulin glarg-yfgn)Pen 100 Units/mL Insulin Pen  Generic drug: insulin glargine-yfgn   30 Units Twice daily  Refills: 0           * This list has 2 medication(s) that are the same as other medications prescribed for you. Read the directions carefully, and ask your doctor or other care provider to review them with you.                     DISCHARGE DISPOSITION:  Home with family    DISCHARGE INSTRUCTIONS:   ADA diet   Follow-up Information       Netty Starring, MD. Schedule an appointment as soon as possible for a visit in 1 week(s).    Specialty: GASTROENTEROLOGY  Contact information:  [address]  [place] [address]  [phone]                           No discharge procedures on file.             Copies sent to Care Team         Relationship Specialty Notifications Start End    Eulogio Ditch, [place] PCP - General PHYSICIAN ASSISTANT  09/05/21     Phone: [phone] Fax: [phone]         [address] [place] [address]            >30 minutes total were spent coordinating discharge day today    Patient was seen and examined along with attending physician.  The chart and plan of care have been reviewed and discussed in detail.  Donalynn Furlong, FNP-BC  [place] HOSPITALIST

## 2021-10-09 NOTE — Care Plan (Signed)
Problem: Adult Inpatient Plan of Care  Goal: Patient-Specific Goal (Individualized)  Recent Flowsheet Documentation  Taken 10/08/2021 2000 by Ronnie Doss, RN  Individualized Care Needs: Monitor labs, vitals, pulse oximetry  Anxieties, Fears or Concerns: oxygen  Patient-Specific Goals (Include Timeframe): Patient would like to be discharge home when medically stable

## 2021-10-09 NOTE — Consults (Signed)
[facility]  Gastroenterology/ Hepatology Consult Note      Patient: Julie Cain, Julie Cain, 61 y.o. female  Date of Birth: 1960/09/01  Admission Date:   PCP: Aleene Davidson, PA-C    History of Present Illness:  States right side of abdomen feels sore. She thinks its from bed. Denies nausea or vomiting. Tolerating diet. Had 2 loose BM's after eating food yesterday evening. No further rectal bleeding.     Review of Systems   Constitutional: Negative for fever.   Gastrointestinal:  Negative for abdominal pain, constipation, diarrhea, hematochezia, melena, nausea and vomiting.       Historical Data   Medications Prior to Admission       Prescriptions    amLODIPine (NORVASC) 5 mg Oral Tablet    Take 1 Tablet (5 mg total) by mouth Once a day    atorvastatin (LIPITOR) 40 mg Oral Tablet    Take 1 Tablet (40 mg total) by mouth Every night    cholecalciferol, vitamin D3, 250 mcg (10,000 unit) Oral Capsule    Take 4 Capsules (40,000 Units total) by mouth Every 3 days Unsure which days she takes this    HUMALOG [facility] INSULIN 100 unit/mL Subcutaneous Insulin Pen    10 Units Three times daily with meals    lamoTRIgine (LAMICTAL) 100 mg Oral Tablet    Take 1 Tablet (100 mg total) by mouth Twice daily    levothyroxine (SYNTHROID) 112 mcg Oral Tablet    1 Tablet (112 mcg total) Every morning    metoprolol succinate (TOPROL-XL) 50 mg Oral Tablet Sustained Release 24 hr    1.5 Tablets (75 mg total) Twice daily    nitroGLYCERIN (NITROSTAT) 0.4 mg Sublingual Tablet, Sublingual    1 Tablet (0.4 mg total) Every 5 minutes as needed    omeprazole (PRILOSEC) 40 mg Oral Capsule, Delayed Release(E.C.)    1 Capsule (40 mg total) Once a day    ONETOUCH VERIO TEST STRIPS Strip    USE TO TEST BLOOD SUGAR TWICE DAILY    risperiDONE (RISPERDAL) 0.5 mg Oral Tablet    Take 1 Tablet (0.5 mg total) by mouth Every night    risperiDONE (RISPERDAL) 2 mg Oral Tablet    Take 1.5 Tablets (3 mg total) by mouth Every night    semaglutide (OZEMPIC)  0.25 mg or 0.5 mg (2 mg/3 mL) Subcutaneous Pen Injector    Inject 0.5 mg under the skin Every 7 days Tuesdays    SEMGLEE,INSULIN GLARG-YFGN,PEN 100 unit/mL (3 mL) Subcutaneous Insulin Pen    30 Units Twice daily          Allergies   Allergen Reactions    Metformin Nausea/ Vomiting          Vitals:  Temperature: 36.5 C (97.7 F)  Heart Rate: 88  Respiratory Rate: 18  BP (Non-Invasive): (!) 124/55  SpO2: 94 %    Objective:  General Appearance:  Comfortable, well-appearing and in no acute distress.    Vital signs: (most recent): Blood pressure (!) 124/55, pulse 88, temperature 36.5 C (97.7 F), resp. rate 18, height 1.575 m (5\' 2" ), weight 93.7 kg (206 lb 8 oz), SpO2 94 %.  Vital signs are normal.  No fever.    Lungs:  Normal effort and normal respiratory rate.  Breath sounds clear to auscultation.    Heart: Normal rate.  Regular rhythm.  S1 normal and S2 normal.    Abdomen: Abdomen is soft.  Bowel sounds are normal.   There is  no abdominal tenderness.     Extremities: Normal range of motion.    Pulses: Distal pulses are intact.    Neurological: Patient is alert and oriented to person, place and time.    Skin:  Warm and dry.      Active Hospital Problems   (*Primary Problem)    Diagnosis    *Bloody stools    HTN (hypertension)    Type 2 diabetes mellitus (CMS HCC)    Hypothyroidism    GERD (gastroesophageal reflux disease)    Bipolar disorder (CMS HCC)    Tremor       Labs:     Results for orders placed or performed during the hospital encounter of 10/07/21 (from the past 24 hour(s))   POC BLOOD GLUCOSE (RESULTS)   Result Value Ref Range    GLUCOSE, POC 128 50 - 500 mg/dl   POC BLOOD GLUCOSE (RESULTS)   Result Value Ref Range    GLUCOSE, POC 175 50 - 500 mg/dl   POC BLOOD GLUCOSE (RESULTS)   Result Value Ref Range    GLUCOSE, POC 177 50 - 500 mg/dl   POC BLOOD GLUCOSE (RESULTS)   Result Value Ref Range    GLUCOSE, POC 217 50 - 500 mg/dl       Imaging Studies:               Assessment/Plan:    Patient Active Problem  List   Diagnosis    Bloody stools    HTN (hypertension)    Type 2 diabetes mellitus (CMS HCC)    Hypothyroidism    GERD (gastroesophageal reflux disease)    Bipolar disorder (CMS HCC)    Tremor      Continue current management and supportive care. Diarrhea and rectal bleeding has resolved. Stool for OB positive. She had a couple of loose BM's yesterday evening after eating but no further bleeding. Hgb stable 12.6.  She had colonoscopy in the office a year ago and had one polyp removed and had internal hemorrhoids. Rectal bleeding likely hemorrhoidal and has since stopped. Observe from GI standpoint. She can FU in office.   Will continue to follow. Further recommendations based on clinical course. Patient discussed in detail with Dr Concha Se and treatment plan decided by him.     Norina Buzzard [place], [place]

## 2021-10-09 NOTE — Respiratory Therapy (Signed)
Over night pulse ox study.    Total Time Below: 1 hr 23 min 10 sec   Longest Duration: 20 min 7 sec   Lowest SpO2: 69 at 10/09/21 05:38:52 AM   Number of Events: 35     Patient started study on room air. Placed on 2l nc at 2345 per nursing

## 2022-01-09 ENCOUNTER — Other Ambulatory Visit (HOSPITAL_COMMUNITY): Payer: Self-pay | Admitting: PSYCHIATRY AND NEUROLOGY-NEUROLOGY

## 2022-01-09 DIAGNOSIS — G20A1 Parkinson's disease without dyskinesia, without mention of fluctuations: Secondary | ICD-10-CM

## 2022-01-20 ENCOUNTER — Ambulatory Visit (HOSPITAL_COMMUNITY): Payer: Self-pay

## 2022-01-22 ENCOUNTER — Other Ambulatory Visit (HOSPITAL_COMMUNITY): Payer: BC Managed Care – PPO

## 2022-03-19 ENCOUNTER — Ambulatory Visit (HOSPITAL_COMMUNITY): Payer: Self-pay

## 2022-03-29 ENCOUNTER — Inpatient Hospital Stay
Admission: RE | Admit: 2022-03-29 | Discharge: 2022-03-29 | Disposition: A | Discharge status: Home / Self Care | Payer: BC Managed Care – PPO | Source: Ambulatory Visit | Attending: PSYCHIATRY AND NEUROLOGY-NEUROLOGY

## 2022-03-29 ENCOUNTER — Other Ambulatory Visit: Payer: Self-pay

## 2022-03-29 DIAGNOSIS — G20A1 Parkinson's disease without dyskinesia, without mention of fluctuations: Secondary | ICD-10-CM

## 2022-03-29 MED ORDER — GADOBUTROL 10 MMOL/10 ML (1 MMOL/ML) INTRAVENOUS SOLUTION
10.0000 mL | INTRAVENOUS | Status: AC
Start: 2022-03-29 — End: 2022-03-29
  Administered 2022-03-29: 9 mL via INTRAVENOUS

## 2022-10-19 ENCOUNTER — Ambulatory Visit (INDEPENDENT_AMBULATORY_CARE_PROVIDER_SITE_OTHER): Payer: Self-pay | Admitting: Student in an Organized Health Care Education/Training Program

## 2022-10-19 ENCOUNTER — Ambulatory Visit (INDEPENDENT_AMBULATORY_CARE_PROVIDER_SITE_OTHER): Payer: Self-pay | Admitting: NURSE PRACTITIONER

## 2022-10-23 ENCOUNTER — Other Ambulatory Visit (HOSPITAL_COMMUNITY): Payer: Self-pay | Admitting: PHYSICIAN ASSISTANT

## 2022-10-23 DIAGNOSIS — Z1239 Encounter for other screening for malignant neoplasm of breast: Secondary | ICD-10-CM

## 2022-11-13 ENCOUNTER — Other Ambulatory Visit: Payer: Self-pay

## 2022-11-13 ENCOUNTER — Inpatient Hospital Stay
Admission: RE | Admit: 2022-11-13 | Discharge: 2022-11-13 | Disposition: A | Discharge status: Home / Self Care | Payer: BC Managed Care – PPO | Source: Ambulatory Visit | Attending: PHYSICIAN ASSISTANT | Admitting: PHYSICIAN ASSISTANT

## 2022-11-13 ENCOUNTER — Encounter (HOSPITAL_COMMUNITY): Payer: Self-pay

## 2022-11-13 DIAGNOSIS — Z1239 Encounter for other screening for malignant neoplasm of breast: Secondary | ICD-10-CM

## 2022-11-13 DIAGNOSIS — R92323 Mammographic fibroglandular density, bilateral breasts: Secondary | ICD-10-CM | POA: Insufficient documentation

## 2022-11-13 DIAGNOSIS — N6489 Other specified disorders of breast: Secondary | ICD-10-CM | POA: Insufficient documentation

## 2022-11-13 DIAGNOSIS — Z1231 Encounter for screening mammogram for malignant neoplasm of breast: Secondary | ICD-10-CM | POA: Insufficient documentation

## 2022-12-03 ENCOUNTER — Ambulatory Visit (INDEPENDENT_AMBULATORY_CARE_PROVIDER_SITE_OTHER): Payer: Self-pay | Admitting: NURSE PRACTITIONER

## 2022-12-10 ENCOUNTER — Ambulatory Visit (INDEPENDENT_AMBULATORY_CARE_PROVIDER_SITE_OTHER): Payer: Self-pay | Admitting: NURSE PRACTITIONER

## 2023-04-03 ENCOUNTER — Other Ambulatory Visit (HOSPITAL_COMMUNITY): Payer: Self-pay | Admitting: PHYSICIAN ASSISTANT

## 2023-04-03 DIAGNOSIS — M545 Low back pain, unspecified: Secondary | ICD-10-CM

## 2023-04-04 ENCOUNTER — Ambulatory Visit
Admission: RE | Admit: 2023-04-04 | Discharge: 2023-04-04 | Disposition: A | Discharge status: Still In Hospital | Payer: Self-pay | Source: Ambulatory Visit | Attending: PHYSICIAN ASSISTANT | Admitting: PHYSICIAN ASSISTANT

## 2023-04-04 ENCOUNTER — Other Ambulatory Visit: Payer: Self-pay

## 2023-04-04 DIAGNOSIS — M545 Low back pain, unspecified: Secondary | ICD-10-CM | POA: Insufficient documentation

## 2023-04-04 NOTE — PT Evaluation (Signed)
 [facility]  Outpatient Physical Therapy  [address]  [place], [address]  [phone]  (Fax) [phone]      Physical Therapy Lumbar Evaluation    Date: 04/04/2023  Patient's Name: Julie Cain  Date of Birth: 08/22/1960  Physical Therapy Evaluation      Evaluating Physical Therapist: Doy Mince PT, DPT  PT diagnosis/Reason for Referral: Low back pain  Next Scheduled Physician Appointment: 3 months  Allergies/Contraindications: None stated,              SUBJECTIVE  Date of onset: 4 years ago     Mechanism of injury: Insidious onset with progressive worsening.     Current Presentation: Patient reports pain at the base of her spine that extends across her back. Patient reports her pain is worse with standing and notes no pain with sitting. Patient reports she has not been to the store in over a year. Patient reports previous attempt of PT in 2021 but notes it exacerbated her symptoms. Patient reports she does not move much because of her pain. Patient denies any radicular symptoms. Patient denies referral to back doctor. Patient denies any AD use. Patient denies falls. Patient reports her doctors believe she may have Parkinson's but has not been diagnosed.     PLOF: Patient has been mostly home bound for the last year due to low back and poor tolerance to standing/walking.    Previous episodes/treatments: Previous bout of HHPT. Patient self D/C after one visit due to pain.     Past Medical History:   Past Medical History:   Diagnosis Date    Bipolar disorder (CMS HCC)     Bipolar disorder, unspecified (CMS HCC)     GERD (gastroesophageal reflux disease)     HTN (hypertension)     Hyperlipidemia     Hypothyroidism     Miscarriage     x3    Tremor     Type 2 diabetes mellitus (CMS HCC)          Past Surgical History:   Past Surgical History:   Procedure Laterality Date    COLONOSCOPY      ESOPHAGOGASTRODUODENOSCOPY      HX CATARACT REMOVAL      HX CHOLECYSTECTOMY       HX HERNIA REPAIR         Medications for this problem:  Patient denies    Diagnostic tests: Patient notes no recent imaging.  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                     Pain description:  Pain so bad I can't stand    Pain frequency:  INTERMITTENT    Pain rating: Now 2/10   Best 0/10   Worst "I can't put a number on it"/10    Radiculopathy: Patient denies    Pain increases with: ACTIVITY, STAND, and WALK           decreases with : SUPINE POSITION, PRONE POSITION, SIDELYING, and SIT    Sensation: Patient denies     Weakness: Patient denies     Sleep affected: Patient reports she has no issues sleeping  but notes she sleeps "too much". Patient notes she sleeps on her sides or stomach.    Bowel/bladder problems: Patient denies    Subjective Functional Reports:    Sitting: WFL    Standing: LIMITED    Walking: LIMITED    Lifting: LIMITED    ADLs: LIMITED          OBJECTIVE    Patient-Specific Functional Score:    Problem Score   1. Standing > 10 minutes  0   2. Walk in grocery store  0   Total 0   Total score = sum of the activity scores/number of activities    Minimal detectable change (90% CI) for avg score = 2 points    Minimal detectable change (90% CI) for single activity score = 3 points         AROM   right left   Flexion 30 NA   Extension 5 NA   Sidebend 7 5   Rotation     ROM comments: Patient denies pain with standing lumbar ROM assessment.  Patient able to tolerate < 4 minutes of static standing.     Strength     right left   Hip flexion (L1,2) 4-/5 4-/5   Hip extension (L5,S1,2) 2/5 2/5   Knee flexion (S1) 4-/5 4-/5   Knee extension (L2-4) 4-/5 4-/5   Ankle DF (L4-5) 4-/5 4-/5   Ankle PF (S1,2) 4-/5 4-/5   Strength comments: Patient does not reports  increased pain with MMT testing.     Palpation: Patient TTP throughout lumbar paraspinals.    Joint mobility: Hypomobile LS throughout    Posture: DECREASED LORDOSIS, INCREASED THORACIC KYPHOSIS, and FORWARD HEAD    Gait:  Slow, rigid gait pattern, wide BOS.    Reflexes    Patellar Absent B   Achilles Absent B   LE Clonus  ( - ) B   Hoffman's  ( - ) B     Special tests:   ( - ) SLR   ( - ) sign of the buttock   ( - ) piriformis test     Patient demos fair tolerance to prone position    Treatment provided:REVIEW OF POC AND GOALS WITH PATIENT, ALL QUESTIONS ANSWERED, PATIENT EDUCATION, and THERAPEUTIC EXERCISE     HEP Access Code:  [email or web address]  URL: [email or web address]  Date: 04/04/2023  Prepared by: Doy Mince    Exercises  - Supine Lower Trunk Rotation  - 1-2 x daily - 7 x weekly - 1 sets - 10 reps  - Supine Bridge  - 1-2 x daily - 7 x weekly - 2 sets - 5 reps  - Hooklying Single Knee to Chest Stretch with Towel  - 1 x daily - 7 x weekly - 1 sets - 3-5 reps - 15-30 seconds hold  - Static Prone on Elbows  - 1 x daily - 7 x weekly - 1 sets - 1 reps - 2-3 minutes hold  -Static stand - 2-5 minutes each hour awake as tolerated    EXERCISE/ACTIVITY NAME REPETITIONS RESISTANCE COMPLETED THIS DOS   HEP Review   Y   Nu-step warm up   Next   Bridge 5x  Y   LTR    Next   Prone on elbows  2'  Y   SKTC 3x20"  Y   STS   Next   Standing hip flexion (elbows on table)    Next  ASSESSMENT    Impression: Patient is a 63 year old female referred to PT secondary to low back pain. Static assessment revealed: forward head, increased thoracic kyphosis and decreased lumbar lordosis. Dynamic assessment revealed:decreased lumbar AROM, decreased BLE gross muscle strength, significant loss of posterior chain strength, and poor tolerance to standing/ambualtion. Patient prognosis is poor/guarded at this time due to previous inability to tolerate low level PT interventions and current pain presentation/functional status.  Will initiate PT on a trial basis 2x per week for four weeks pending tolerance to PT intervention.  Patient will benefit from skilled PT to reduce subjective complaints, improve pain free lumbar AROM, improve BLE gross muscle strength, and improve tolerance to standing/walking in order to optimize functional mobility and maximize functional independence.      Rehab potential: POOR and GUARDED      Goals:     Short-Term Goals: 2 Weeks     - Patient will demonstrate tolerance to PT interventions    - Patient will be independent with progressive HEP to maximize gains from PT.     - Patient will report max 7 LBP to aid in participation in PT.     - Patient will report Tolerance to standing > 5 minutes     - Patient will exhibit functional trunk mobility and strength to allow non-limiting bed mobility activities due to pain.            Long-Term Goals: 4 Weeks     - Patient will demonstrate improved BLE strength of at least 4+/5 to aid in functional transfers.     - Patient will demonstrate static standing > 10 minutes     - Patient will demonstrate improved functional ability via improved Patient Specific Functional Score to at least 4.         PLAN  Patient will attend 2 times per week x 4 weeks. Therapy may include, but is not limited to THERAPEUTIC EXERCISES, MYOFASCIAL/JOINT MOBILIZATION, POSTURE/BODY MECHANICS, TRANSFER/GAIT TRAINING, HOME INSTRUCTIONS, HEAT/COLD, and KINESIOTAPE    Plan for next visit: Progress per flowsheet.        Evaluation complexity:   Personal factors impacting POC: FREQUENT OR CHRONIC PAIN   Co-morbidities impacting POC: PRE-EXISTING NEUROLOGICAL DEFICITS  Complexity of physical exam: INCLUDING MUSCULOSKELETAL SYSTEM (POSTURE, ROM, STRENGTH, HEIGHT/WEIGHT), INCLUDING NEUROMUSCULAR EXAM (BALANCE, GAIT, LOCOMOTION, MOBILITY), and INCLUDING ACTIVITY/MOBILITY RESTRICTIONS   Clinical Presentation: STABLE   Evaluation Complexity: LOW-HISTORY 0, EXAMINATION 1-2, STABLE PRESENTATION        Total  Session Time 60, Timed code minutes 15, and Untimed code minutes 45        Intervention minutes: EVALUATION 45 minutes and THERAPEUTIC EXERCISE 15 minutes    Doy Mince, PT  04/04/2023, 12:45                Certification:    From:______  Through:_________    I certify the need for these services furnished under this plan of treatment and while under my care.    Referring Provider Signature: _______________     Date : _____________________    Printed name of Referring Provider: ___________________________________________

## 2023-04-09 ENCOUNTER — Other Ambulatory Visit: Payer: Self-pay

## 2023-04-09 ENCOUNTER — Ambulatory Visit (HOSPITAL_COMMUNITY)
Admission: RE | Admit: 2023-04-09 | Discharge: 2023-04-09 | Disposition: A | Discharge status: Still In Hospital | Payer: Self-pay | Source: Ambulatory Visit

## 2023-04-09 DIAGNOSIS — M545 Low back pain, unspecified: Secondary | ICD-10-CM

## 2023-04-09 NOTE — PT Treatment (Signed)
 [facility] Medicine [facility]  Outpatient Physical Therapy  [address]  [place], [address]  [phone]  (Fax) [phone]    Physical Therapy Treatment Note    Date: 04/09/2023  Patient's Name: Julie Cain  Date of Birth: 1960-11-03  Physical Therapy Visit            Visit #/POC: 2 of up to 8 planned  Authorization:20 per FY  POC Signed?: N/A  POC Ends: 05/02/23  Order Ends: open order  Next Progress Note Due: by visit 8 or before 05/02/23    Evaluating Physical Therapist: Doy Mince PT, DPT  PT diagnosis/Reason for Referral: Low back pain  Next Scheduled Physician Appointment: 3 months  Allergies/Contraindications: None stated,       Subjective: Patient relates she had to shower before coming and that is really difficult. She relates she gets very tired and painful taking a shower even though she sits on a bench seat. She rates pain at rest 0/10 and "I can't stand it" with standing up/activity.     Objective: Treatment delivered as noted below:    Measured ROM: NT today       EXERCISE/ACTIVITY NAME REPETITIONS RESISTANCE COMPLETED THIS DOS   HEP Review     Y   Nu-step warm up 7 minutes Level 1 Y   Bridge 10   Y   LTR  10     Y    Prone on elbows  2'   Y   SKTC 10 x 2 sec each   Y   STS 10 (no UE)   Y    Standing hip flexion (elbows on table)      Next      HEP: - Supine Lower Trunk Rotation  - 1-2 x daily - 7 x weekly - 1 sets - 10 reps  - Supine Bridge  - 1-2 x daily - 7 x weekly - 2 sets - 5 reps  - Hooklying Single Knee to Chest Stretch with Towel  - 1 x daily - 7 x weekly - 1 sets - 3-5 reps - 15-30 seconds hold  - Static Prone on Elbows  - 1 x daily - 7 x weekly - 1 sets - 1 reps - 2-3 minutes hold  -Static stand - 2-5 minutes each hour awake as tolerated                 Assessment: Patient demonstrates core weakness with activities in clinic. Labored with transitions onto mat table and out of supine and into prone. Requires rest breaks throughout session due to low activity  tolerance.     Short-Term Goals: 2 Weeks     - Patient will demonstrate tolerance to PT interventions    - Patient will be independent with progressive HEP to maximize gains from PT.     - Patient will report max 7 LBP to aid in participation in PT.     - Patient will report Tolerance to standing > 5 minutes      - Patient will exhibit functional trunk mobility and strength to allow non-limiting bed mobility activities due to pain.            Long-Term Goals: 4 Weeks     - Patient will demonstrate improved BLE strength of at least 4+/5 to aid in functional transfers.     - Patient will demonstrate static standing > 10 minutes     - Patient will demonstrate improved functional ability via improved  Patient Specific Functional Score to at least 4.     Plan:  Assess tolerance to today's additions. Progress as appropriate.     Total Session Time 35, Timed code minutes 35, and Untimed code minutes 0  THERAPEUTIC EXERCISE 35 minutes      Mililani Murthy, PTA  04/09/2023, 14:02

## 2023-04-11 ENCOUNTER — Ambulatory Visit (HOSPITAL_COMMUNITY): Payer: Self-pay

## 2023-04-12 ENCOUNTER — Ambulatory Visit (HOSPITAL_COMMUNITY)
Admission: RE | Admit: 2023-04-12 | Discharge: 2023-04-12 | Disposition: A | Discharge status: Still In Hospital | Payer: Self-pay | Source: Ambulatory Visit

## 2023-04-12 NOTE — PT Treatment (Signed)
 [facility] Medicine [facility]  Outpatient Physical Therapy  [address]  [place], [address]  [phone]  (Fax) [phone]    Physical Therapy Treatment Note    Date: 04/12/2023  Patient's Name: Julie Cain  Date of Birth: [birth year]  Physical Therapy Visit            Visit #/POC: 3 of up to 8 planned  Authorization:20 per FY  POC Signed?: N/A  POC Ends: 05/02/23  Order Ends: open order  Next Progress Note Due: by visit 8 or before 05/02/23    Evaluating Physical Therapist: Doy Mince PT, DPT  PT diagnosis/Reason for Referral: Low back pain  Next Scheduled Physician Appointment: 3 months  Allergies/Contraindications: None stated,       Subjective: Patient reports she feels fine as long as she is sitting down. Reports if she stands up it feels like she can't stand up straight and is really painful.     Objective: Treatment delivered as noted below:    Measured ROM: NT today       EXERCISE/ACTIVITY NAME REPETITIONS RESISTANCE COMPLETED THIS DOS   HEP Review     Y   Nu-step warm up 7 minutes Level 3 Y   Bridge 10   Y   LTR  10     Y    Prone on elbows  3'   Y   SKTC 10 x 5 sec each   Y   STS 10 (no UE)   Y    Standing hip flexion (elbows on table)      Next   Hooklying hip abduction  10 each   B/L/R Red  Yes   HEP 04/12/23      HEP: - Supine Lower Trunk Rotation  - 1-2 x daily - 7 x weekly - 1 sets - 10 reps  - Supine Bridge  - 1-2 x daily - 7 x weekly - 2 sets - 5 reps  - Hooklying Single Knee to Chest Stretch with Towel  - 1 x daily - 7 x weekly - 1 sets - 3-5 reps - 15-30 seconds hold  - Static Prone on Elbows  - 1 x daily - 7 x weekly - 1 sets - 1 reps - 2-3 minutes hold  -Static stand - 2-5 minutes each hour awake as tolerated             Access Code: [email or web address]  URL: [email or web address]  Date: 04/12/2023  Prepared by: Camelia Eng    Exercises  - Hooklying Clamshell with Resistance  - 1 x daily - 7 x weekly - 3 sets - 10 reps  - Hooklying Isometric Clamshell  - 1 x daily - 7  x weekly - 3 sets - 10 reps    Assessment:  Patient tolerated session well. She is still labored with transitions but not as significantly as last session. Continues to demonstrate core weakness as evidenced by difficulty maintaining abdominal brace during hooklying hip abductions.     Short-Term Goals: 2 Weeks     - Patient will demonstrate tolerance to PT interventions    - Patient will be independent with progressive HEP to maximize gains from PT.     - Patient will report max 7 LBP to aid in participation in PT.     - Patient will report Tolerance to standing > 5 minutes      - Patient will exhibit functional trunk mobility and strength to allow non-limiting bed mobility activities due to  pain.            Long-Term Goals: 4 Weeks     - Patient will demonstrate improved BLE strength of at least 4+/5 to aid in functional transfers.     - Patient will demonstrate static standing > 10 minutes     - Patient will demonstrate improved functional ability via improved Patient Specific Functional Score to at least 4.     Plan:   Continue progression as able.     Total Session Time 34, Timed code minutes 34, and Untimed code minutes 0  THERAPEUTIC EXERCISE 34 minutes    Teron Blais, PTA  04/12/2023 15:34

## 2023-04-17 ENCOUNTER — Other Ambulatory Visit: Payer: Self-pay

## 2023-04-17 ENCOUNTER — Ambulatory Visit (HOSPITAL_COMMUNITY)
Admission: RE | Admit: 2023-04-17 | Discharge: 2023-04-17 | Disposition: A | Discharge status: Still In Hospital | Payer: Self-pay | Source: Ambulatory Visit

## 2023-04-17 NOTE — PT Treatment (Signed)
 [facility] Medicine [facility]  Outpatient Physical Therapy  [address]  [place], [address]  [phone]  (Fax) [phone]    Physical Therapy Treatment Note    Date: 04/17/2023  Patient's Name: Julie Cain  Date of Birth: 23-Aug-1960  Physical Therapy Visit            Visit #/POC: 4 of up to 8 planned  Authorization:20 per FY  POC Signed?: N/A  POC Ends: 05/02/23  Order Ends: open order  Next Progress Note Due: by visit 8 or before 05/02/23     Evaluating Physical Therapist: Doy Mince PT, DPT  PT diagnosis/Reason for Referral: Low back pain  Next Scheduled Physician Appointment: 3 months  Allergies/Contraindications: None stated,         Subjective: Patient reports compliance with HEP and notes good tolerance to PT treatments thus far. Patient notes no significant increase in soreness following PT. Patient notes she doesn't feel she is able to stand or walk any longer at this point.      Objective: Treatment delivered as noted below:     Measured ROM: NT today        EXERCISE/ACTIVITY NAME REPETITIONS RESISTANCE COMPLETED THIS DOS   HEP Review     Y   Nu-step warm up 7 minutes Level 3 Y   Bridge 3x5   Y   LTR  2x10     Y    Prone on elbows  3'   Y   SKTC 10 x 10 sec each   Y   STS 2x10 (no UE)   Y    Standing hip flexion (elbows on table)      Next   Hooklying hip abduction  10 each   B/L/R Red  Yes   HEP 04/12/23      HEP: - Supine Lower Trunk Rotation  - 1-2 x daily - 7 x weekly - 1 sets - 10 reps  - Supine Bridge  - 1-2 x daily - 7 x weekly - 2 sets - 5 reps  - Hooklying Single Knee to Chest Stretch with Towel  - 1 x daily - 7 x weekly - 1 sets - 3-5 reps - 15-30 seconds hold  - Static Prone on Elbows  - 1 x daily - 7 x weekly - 1 sets - 1 reps - 2-3 minutes hold  -Static stand - 2-5 minutes each hour awake as tolerated               Assessment:  Patient demos good tolerance to exercise program and progression of reps this date. Patient encouraged to continue HEP and stand up  throughout the day.      Short-Term Goals: 2 Weeks     - Patient will demonstrate tolerance to PT interventions    - Patient will be independent with progressive HEP to maximize gains from PT.     - Patient will report max 7 LBP to aid in participation in PT.     - Patient will report Tolerance to standing > 5 minutes      - Patient will exhibit functional trunk mobility and strength to allow non-limiting bed mobility activities due to pain.            Long-Term Goals: 4 Weeks     - Patient will demonstrate improved BLE strength of at least 4+/5 to aid in functional transfers.     - Patient will demonstrate static standing > 10 minutes     -  Patient will demonstrate improved functional ability via improved Patient Specific Functional Score to at least 4.      Plan:   Continue progression as able.        Total Session Time 38 and Timed code minutes 38  THERAPEUTIC EXERCISE 38 minutes      Doy Mince, PT  04/17/2023, 14:33

## 2023-04-19 ENCOUNTER — Other Ambulatory Visit: Payer: Self-pay

## 2023-04-19 ENCOUNTER — Ambulatory Visit (HOSPITAL_COMMUNITY)
Admission: RE | Admit: 2023-04-19 | Discharge: 2023-04-19 | Disposition: A | Discharge status: Still In Hospital | Payer: Self-pay | Source: Ambulatory Visit

## 2023-04-19 DIAGNOSIS — M545 Low back pain, unspecified: Secondary | ICD-10-CM

## 2023-04-19 NOTE — PT Treatment (Signed)
 [facility] Medicine [facility]  Outpatient Physical Therapy  [address]  [place], [address]  [phone]  (Fax) [phone]    Physical Therapy Treatment Note    Date: 04/19/2023  Patient's Name: Julie Cain  Date of Birth: [birth year]  Physical Therapy Visit            Visit #/POC:  5 of up to 8 planned  Authorization:20 per FY  POC Signed?: N/A  POC Ends: 05/02/23  Order Ends: open order  Next Progress Note Due: by visit 8 or before 05/02/23     Evaluating Physical Therapist: Doy Mince PT, DPT  PT diagnosis/Reason for Referral: Low back pain  Next Scheduled Physician Appointment: 3 months  Allergies/Contraindications: None stated,         Subjective: Patient relates she had some increased "catching" in her back after last session. She is in no pain at present. States she typically does not have pain if she is sitting down.      Objective: Treatment delivered as noted below:     Measured ROM: NT today        EXERCISE/ACTIVITY NAME REPETITIONS RESISTANCE COMPLETED THIS DOS   HEP Review     N   Nu-step warm up 8 minutes Level 4 Y   Bridge 3x5   Y   LTR  2x10     Y    Prone on elbows  3'   Y   SKTC 10 x 10 sec each   Y   STS 2x10 (no UE)   Y    Standing hip flexion (elbows on table)      N   Hooklying hip abduction  2 x 10 each   B/L/R Green Yes   HEP 04/12/23   Hooklying march 10 each Green  Yes       HEP: - Supine Lower Trunk Rotation  - 1-2 x daily - 7 x weekly - 1 sets - 10 reps  - Supine Bridge  - 1-2 x daily - 7 x weekly - 2 sets - 5 reps  - Hooklying Single Knee to Chest Stretch with Towel  - 1 x daily - 7 x weekly - 1 sets - 3-5 reps - 15-30 seconds hold  - Static Prone on Elbows  - 1 x daily - 7 x weekly - 1 sets - 1 reps - 2-3 minutes hold  -Static stand - 2-5 minutes each hour awake as tolerated               Assessment:   Tolerated session well today. No report of increased pain at end of session. Cueing needed for abdominal bracing during hooklying march and hip abductions.       Short-Term Goals: 2 Weeks     - Patient will demonstrate tolerance to PT interventions    - Patient will be independent with progressive HEP to maximize gains from PT.     - Patient will report max 7 LBP to aid in participation in PT.     - Patient will report Tolerance to standing > 5 minutes      - Patient will exhibit functional trunk mobility and strength to allow non-limiting bed mobility activities due to pain.            Long-Term Goals: 4 Weeks     - Patient will demonstrate improved BLE strength of at least 4+/5 to aid in functional transfers.     - Patient will demonstrate static standing >  10 minutes     - Patient will demonstrate improved functional ability via improved Patient Specific Functional Score to at least 4.      Plan:   Continue progression as able.        Total Session Time 38 and Timed code minutes 38  THERAPEUTIC EXERCISE 38 minutes      Damire Remedios, PTA  04/19/2023 13:58

## 2023-04-23 ENCOUNTER — Ambulatory Visit (HOSPITAL_COMMUNITY): Payer: Self-pay

## 2023-04-24 ENCOUNTER — Ambulatory Visit (HOSPITAL_COMMUNITY)
Admission: RE | Admit: 2023-04-24 | Discharge: 2023-04-24 | Disposition: A | Discharge status: Still In Hospital | Source: Ambulatory Visit

## 2023-04-24 ENCOUNTER — Other Ambulatory Visit: Payer: Self-pay

## 2023-04-24 NOTE — PT Treatment (Signed)
 [facility] Medicine [facility]  Outpatient Physical Therapy  [address]  [place], [address]  [phone]  (Fax) [phone]    Physical Therapy Treatment Note    Date: 04/24/2023  Patient's Name: Julie Cain  Date of Birth: [birth year]  Physical Therapy Visit            Visit #/POC:  6 of up to 8 planned  Authorization:20 per FY  POC Signed?: N/A  POC Ends: 05/02/23  Order Ends: open order  Next Progress Note Due: by visit 8 or before 05/02/23     Evaluating Physical Therapist: Doy Mince PT, DPT  PT diagnosis/Reason for Referral: Low back pain  Next Scheduled Physician Appointment: 3 months  Allergies/Contraindications: None stated,         Subjective:  Patient reports she does not feel she has made progression with therapy thus far. Continues to be unable to stand long enough to make a sandwich.  Unable to rate pain. Denies radicular pain. She states today walking from parking lot her back "done good," but her c/o with amb today is R knee pain.      Patient-Specific Functional Score:     Problem Score                             04/23/23   1. Standing > 10 minutes  0                                      0   2. Walk in grocery store  0                                      0   Total 0   Total score = sum of the activity scores/number of activities    Minimal detectable change (90% CI) for avg score = 2 points    Minimal detectable change (90% CI) for single activity score = 3 points        Objective: Treatment delivered as noted below:     Measured ROM: NT today        EXERCISE/ACTIVITY NAME REPETITIONS RESISTANCE COMPLETED THIS DOS   HEP Review     N   Nu-step warm up 10  minutes Level 4 Y   Bridge 3x5   Y   LTR  x10     Y    Prone on elbows  3'   n   SKTC 10 x 10 sec each   n   STS 2x10 (no UE)   n   Standing hip flexion (elbows on table)      N   Hooklying hip abduction  2 x 10 each   B/L/R Green Yes   HEP 04/12/23   Hooklying march  --hooklying march with abdominal brace: "wee march" 10  each    2x10 Green  N    y   Seated trunk ROT resisted X10 ea Red Tband y   Seated Paloff press X10 ea Red Tband y      HEP: - Supine Lower Trunk Rotation  - 1-2 x daily - 7 x weekly - 1 sets - 10 reps  - Supine Bridge  - 1-2 x daily - 7 x weekly - 2 sets -  5 reps  - Hooklying Single Knee to Chest Stretch with Towel  - 1 x daily - 7 x weekly - 1 sets - 3-5 reps - 15-30 seconds hold  - Static Prone on Elbows  - 1 x daily - 7 x weekly - 1 sets - 1 reps - 2-3 minutes hold  -Static stand - 2-5 minutes each hour awake as tolerated               Assessment:  Patient is unable to rate pain when asked on multiple occasions. She has made no progression with PSFS. Resisted hooklying hip abd L>R with weakness evidenced by visible RLE over powering L. Continues with difficulty activating TA with core strengthening. She requires max cues to perform exercises with correct technique. Progressed core strengthening this day with seated exercises noted above, she does report "I feel this in my mid back."     Short-Term Goals: 2 Weeks     - Patient will demonstrate tolerance to PT interventions    - Patient will be independent with progressive HEP to maximize gains from PT. (MET 04/24/23)    - Patient will report max 7 LBP to aid in participation in PT.     - Patient will report Tolerance to standing > 5 minutes      - Patient will exhibit functional trunk mobility and strength to allow non-limiting bed mobility activities due to pain.            Long-Term Goals: 4 Weeks     - Patient will demonstrate improved BLE strength of at least 4+/5 to aid in functional transfers.     - Patient will demonstrate static standing > 10 minutes     - Patient will demonstrate improved functional ability via improved Patient Specific Functional Score to at least 4.      Plan:   Assess response to treatment this day.        Total Session Time 43 and Timed code minutes 43  THERAPEUTIC EXERCISE 43 minutes    Tarri Abernethy, PTA  04/24/2023 11:25

## 2023-04-26 ENCOUNTER — Other Ambulatory Visit: Payer: Self-pay

## 2023-04-26 ENCOUNTER — Ambulatory Visit (HOSPITAL_COMMUNITY)
Admission: RE | Admit: 2023-04-26 | Discharge: 2023-04-26 | Disposition: A | Discharge status: Still In Hospital | Payer: Self-pay | Source: Ambulatory Visit

## 2023-04-26 NOTE — PT Treatment (Signed)
 [facility] Medicine [facility]  Outpatient Physical Therapy  [address]  [place], [address]  [phone]  (Fax) [phone]    Physical Therapy Treatment Note    Date: 04/26/2023  Patient's Name: Steffie Waggoner  Date of Birth: Nov 29, 1960  Physical Therapy Visit            Visit #/POC:  7 of up to 8 planned  Authorization:20 per FY  POC Signed?: N/A  POC Ends: 05/02/23  Order Ends: open order  Next Progress Note Due: by visit 8 or before 05/02/23     Evaluating Physical Therapist: Floy Hutch PT, DPT  PT diagnosis/Reason for Referral: Low back pain  Next Scheduled Physician Appointment: 3 months  Allergies/Contraindications: None stated,         Subjective:  Patient continues to report she is not improving with therapy. She reports compliance with HEP all except performing static standing 5-10 min multiple times per day. Patient reports it is difficult to say last visit caused her to have a hard time with functional tasks d/t her having had this difficulty for some time now. She states she did had a hard time getting to her car following session, but is unable to give pain rating.      Patient-Specific Functional Score:     Problem Score                             04/23/23   1. Standing > 10 minutes  0                                      0   2. Walk in grocery store  0                                      0   Total 0   Total score = sum of the activity scores/number of activities    Minimal detectable change (90% CI) for avg score = 2 points    Minimal detectable change (90% CI) for single activity score = 3 points        Objective: Treatment delivered as noted below:     Measured ROM: NT today        EXERCISE/ACTIVITY NAME REPETITIONS RESISTANCE COMPLETED THIS DOS   HEP Review     N   Nu-step warm up 10  minutes Level 4 Y   Bridge 2x10   Y   LTR  x10     Y    Prone on elbows  3'   n   SKTC 10 x 10 sec each   n   STS 2x10 (no UE)   n   Standing hip flexion (elbows on table)      N    Hooklying hip abduction  2 x 10 each   B/L/R Green Yes   HEP 04/12/23   Hooklying march  --hooklying march with abdominal brace: "wee march" 10 each    x10 Green  y    y   Seated trunk ROT resisted X10 ea Red Tband y   Seated Paloff press X10 ea Red Tband y   Static unsupported sitting 10 min  y   Seated resisted lumbar extension 2x10 Ringo Sherod Tband y   Seated  rows 2x10 Yellow Tband y   Seated low rows         HEP: - Supine Lower Trunk Rotation  - 1-2 x daily - 7 x weekly - 1 sets - 10 reps  - Supine Bridge  - 1-2 x daily - 7 x weekly - 2 sets - 5 reps  - Hooklying Single Knee to Chest Stretch with Towel  - 1 x daily - 7 x weekly - 1 sets - 3-5 reps - 15-30 seconds hold  - Static Prone on Elbows  - 1 x daily - 7 x weekly - 1 sets - 1 reps - 2-3 minutes hold  -Static stand - 2-5 minutes each hour awake as tolerated               Assessment:  It is very challenging to get subjective information to have clear understanding if she is making any true progress. She was able to perform bridging today with increased reps. Worked with static prolonged sitting to facilitate core strengthening since she is unable to tolerate in standing. Initiated scapular strengthening with resistance in seated to promote better tolerance. Close supervision required with mod VC to perform exercises more correctly.     Short-Term Goals: 2 Weeks     - Patient will demonstrate tolerance to PT interventions    - Patient will be independent with progressive HEP to maximize gains from PT. (MET 04/24/23)    - Patient will report max 7 LBP to aid in participation in PT.     - Patient will report Tolerance to standing > 5 minutes      - Patient will exhibit functional trunk mobility and strength to allow non-limiting bed mobility activities due to pain.            Long-Term Goals: 4 Weeks     - Patient will demonstrate improved BLE strength of at least 4+/5 to aid in functional transfers.     - Patient will demonstrate static standing > 10 minutes     -  Patient will demonstrate improved functional ability via improved Patient Specific Functional Score to at least 4.      Plan:   Assess how patient tolerated treatment this day and if her functional ability shows any improvements.     Total Session Time 47 and Timed code minutes 47  THERAPEUTIC EXERCISE 47 minutes    Denise First, PTA  04/26/2023 14:50

## 2023-04-30 ENCOUNTER — Ambulatory Visit
Admission: RE | Admit: 2023-04-30 | Discharge: 2023-04-30 | Disposition: A | Discharge status: Still In Hospital | Payer: Self-pay | Source: Ambulatory Visit | Attending: PHYSICIAN ASSISTANT | Admitting: PHYSICIAN ASSISTANT

## 2023-04-30 ENCOUNTER — Other Ambulatory Visit: Payer: Self-pay

## 2023-04-30 NOTE — PT Treatment (Signed)
 [facility]  Outpatient Physical Therapy  [address]  [place], [address]  [phone]  (Fax) [phone]    Physical Therapy Discharge Note    Date: 04/30/2023  Patient's Name: Julie Cain  Date of Birth: August 07, 1960  Physical Therapy Discharge            Visit #/POC:  8 of up to 8 planned  Authorization:20 per FY  POC Signed?: N/A  POC Ends: 05/02/23  Order Ends: open order  Next Progress Note Due:      Evaluating Physical Therapist: Floy Hutch PT, DPT  PT diagnosis/Reason for Referral: Low back pain  Next Scheduled Physician Appointment: 3 months  Allergies/Contraindications: None stated,         Subjective:  Patient reports no improvements with PT. Patient reports PT did not increase pain but also has not provided relief. Patient reports no improvement to tolerance to functional activity. Patient reports right knee pain with physical activity. Patient is unable to rate pain that she experiences but notes pain limits her ability to stand to prepare meals, shower or grocery shop.      Patient-Specific Functional Score:  Problem Score IE                               04/22/33 04/30/23   1. Standing > 10 minutes  0                                       0 0   2. Walk in grocery store  0                                       0 0   Total 0 0 0   Total score = sum of the activity scores/number of activities    Minimal detectable change (90% CI) for avg score = 2 points    Minimal detectable change (90% CI) for single activity score = 3 points           Objective: Treatment delivered as noted below:     AROM    Right IE  Right 04/30/23  Left IE  Left 04/30/23   Flexion 30 25 NA NA   Extension 5 10 NA NA   Sidebend 7 6 5 10    ROM comments: Patient denies pain with standing lumbar ROM assessment.  Patient able to tolerate < 4 minutes of static standing.      Strength       Right IE  Right 04/30/23 Left IE  Left 04/30/23   Hip flexion (L1,2) 4-/5 4/5 4-/5 4/5   Hip extension  (L5,S1,2) 2/5 2/5 2/5 2/5   Knee flexion (S1) 4-/5 4/5 4-/5 4/5    Knee extension (L2-4) 4-/5 4/5 4-/5 4/5   Ankle DF (L4-5) 4-/5 4/5 4-/5 4/5   Ankle PF (S1,2) 4-/5 4/5 4-/5 4/5   Strength comments: Patient does not reports increased pain with MMT testing.       EXERCISE/ACTIVITY NAME REPETITIONS RESISTANCE COMPLETED THIS DOS   HEP Review     Y   Nu-step warm up 10  minutes Level 4 N   Bridge 2x10   N   LTR  x10  N    Prone on elbows  3'   N   SKTC 10 x 10 sec each   N   STS 2x10 (no UE)   N   Standing hip flexion (elbows on table)      N   Hooklying hip abduction  2 x 10 each   B/L/R Marrie Sizer N   Hooklying march  --hooklying march with abdominal brace: "wee march" 10 each     x10 Green  N     Seated trunk ROT resisted X10 ea Red Tband N   Seated Paloff press X10 ea Red Tband N   Static unsupported sitting 10 min   N   Seated resisted lumbar extension 2x10 Black Tband N   Seated rows 2x10 Yellow Tband N   Objective reassessment, expanded HEP   Y      HEP: - Supine Lower Trunk Rotation  - 1-2 x daily - 7 x weekly - 1 sets - 10 reps  - Supine Bridge  - 1-2 x daily - 7 x weekly - 2 sets - 5 reps  - Hooklying Single Knee to Chest Stretch with Towel  - 1 x daily - 7 x weekly - 1 sets - 3-5 reps - 15-30 seconds hold  - Static Prone on Elbows  - 1 x daily - 7 x weekly - 1 sets - 1 reps - 2-3 minutes hold  -Static stand - 2-5 minutes each hour awake as tolerated          Access Code: [email or web address]  URL: [email or web address]  Date: 04/30/2023  Prepared by: Floy Hutch    Exercises  - Supine Lower Trunk Rotation  - 1-2 x daily - 7 x weekly - 1 sets - 10 reps  - Hooklying Single Knee to Chest Stretch with Towel  - 1-2 x daily - 7 x weekly - 1 sets - 3-5 reps - 15-30 seconds hold  - Static Prone on Elbows  - 1-2 x daily - 7 x weekly - 1 sets - 1 reps - 2-3 minutes hold  - Supine Bridge  - 1 x daily - 7 x weekly - 3 sets - 10 reps  - Sit to Stand  - 1 x daily - 7 x weekly - 2 sets - 10 reps  - Hooklying Clamshell with  Resistance  - 1 x daily - 7 x weekly - 2 sets - 10 reps  - Supine March with Resistance Band  - 1 x daily - 7 x weekly - 2 sets - 10 reps  - Seated March with Resistance  - 1 x daily - 7 x weekly - 2 sets - 10 reps  - Seated Shoulder Horizontal Abduction with Resistance  - 1 x daily - 7 x weekly - 2 sets - 10 reps  -Static stand - 2-5 minutes each hour awake as tolerated  -Static unsupported sitting 5-10 minutes      Assessment:  Patient has made limited to poor progress towards established short and long term PT goals at this time. Patient notes no reduction in pain or improved tolerance to functional activity since [facility]. Patient continues to note significant limitations in ADLs and IADLs  due to her low back pain. Patient to be D/C from POC at this time and referred back to PCP. Discussed at length with patient the need for maintaining HEP to continue to remain active and to improve tolerance. Recommended to patient to discuss with physician ways to assist with pain  management. Patient functional tolerance is primarily limited by low back at this time. Patient verbalized understanding. Patient given expanded HEP which was reviewed with patient.      Short-Term Goals: 2 Weeks     - Patient will demonstrate tolerance to PT interventions ( MET 04/30/23 )     - Patient will be independent with progressive HEP to maximize gains from PT. (MET 04/24/23)    - Patient will report max 7 LBP to aid in participation in PT. ( Unable to assess )     - Patient will report Tolerance to standing > 5 minutes ( NOT MET 04/30/23 )      - Patient will exhibit functional trunk mobility and strength to allow non-limiting bed mobility activities due to pain.  ( NOT MET 04/30/23 )           Long-Term Goals: 4 Weeks     - Patient will demonstrate improved BLE strength of at least 4+/5 to aid in functional transfers. ( PROGRESSING 04/30/23 )     - Patient will demonstrate static standing > 10 minutes ( NOT MET 04/30/23 )     - Patient will  demonstrate improved functional ability via improved Patient Specific Functional Score to at least 4. ( NOT MET 04/30/23 )      Plan:   Discharge from formal PT and refer back to PCP for further management of pain due to limited progress. Patient given expanded HEP to continue exercise/physical activity.     Total Session Time 38 and Timed code minutes 38  THERAPEUTIC EXERCISE 38 minutes      Floy Hutch, PT  04/30/2023, 14:36

## 2023-05-02 ENCOUNTER — Ambulatory Visit (HOSPITAL_COMMUNITY): Payer: Self-pay

## 2023-05-08 ENCOUNTER — Other Ambulatory Visit (HOSPITAL_COMMUNITY): Payer: Self-pay | Admitting: Family

## 2023-05-08 ENCOUNTER — Encounter (HOSPITAL_COMMUNITY): Payer: Self-pay

## 2023-06-07 ENCOUNTER — Ambulatory Visit

## 2023-06-10 ENCOUNTER — Ambulatory Visit (INDEPENDENT_AMBULATORY_CARE_PROVIDER_SITE_OTHER): Payer: Self-pay | Admitting: NURSE PRACTITIONER

## 2023-08-15 ENCOUNTER — Other Ambulatory Visit (HOSPITAL_COMMUNITY): Payer: Self-pay | Admitting: PHYSICIAN ASSISTANT

## 2023-08-15 DIAGNOSIS — M545 Low back pain, unspecified: Secondary | ICD-10-CM

## 2023-09-23 ENCOUNTER — Ambulatory Visit (HOSPITAL_COMMUNITY): Payer: Self-pay

## 2023-09-23 ENCOUNTER — Ambulatory Visit: Payer: Self-pay

## 2023-09-29 ENCOUNTER — Inpatient Hospital Stay
Admission: EM | Admit: 2023-09-29 | Discharge: 2023-10-01 | DRG: 177 | Disposition: A | Discharge status: Home / Self Care

## 2023-09-29 ENCOUNTER — Encounter (HOSPITAL_COMMUNITY): Payer: Self-pay

## 2023-09-29 ENCOUNTER — Other Ambulatory Visit: Payer: Self-pay

## 2023-09-29 ENCOUNTER — Emergency Department (HOSPITAL_COMMUNITY)

## 2023-09-29 DIAGNOSIS — G4730 Sleep apnea, unspecified: Secondary | ICD-10-CM | POA: Diagnosis present

## 2023-09-29 DIAGNOSIS — Z6839 Body mass index (BMI) 39.0-39.9, adult: Secondary | ICD-10-CM

## 2023-09-29 DIAGNOSIS — Z66 Do not resuscitate: Secondary | ICD-10-CM | POA: Diagnosis present

## 2023-09-29 DIAGNOSIS — E119 Type 2 diabetes mellitus without complications: Secondary | ICD-10-CM

## 2023-09-29 DIAGNOSIS — F319 Bipolar disorder, unspecified: Secondary | ICD-10-CM

## 2023-09-29 DIAGNOSIS — R0989 Other specified symptoms and signs involving the circulatory and respiratory systems: Secondary | ICD-10-CM

## 2023-09-29 DIAGNOSIS — R059 Cough, unspecified: Secondary | ICD-10-CM

## 2023-09-29 DIAGNOSIS — Z28310 Unvaccinated for covid-19: Secondary | ICD-10-CM

## 2023-09-29 DIAGNOSIS — R0902 Hypoxemia: Secondary | ICD-10-CM

## 2023-09-29 DIAGNOSIS — U071 COVID-19: Principal | ICD-10-CM | POA: Diagnosis present

## 2023-09-29 DIAGNOSIS — E669 Obesity, unspecified: Secondary | ICD-10-CM | POA: Diagnosis present

## 2023-09-29 DIAGNOSIS — Z79899 Other long term (current) drug therapy: Secondary | ICD-10-CM

## 2023-09-29 DIAGNOSIS — Z794 Long term (current) use of insulin: Secondary | ICD-10-CM

## 2023-09-29 DIAGNOSIS — R251 Tremor, unspecified: Secondary | ICD-10-CM

## 2023-09-29 DIAGNOSIS — E785 Hyperlipidemia, unspecified: Secondary | ICD-10-CM

## 2023-09-29 DIAGNOSIS — J189 Pneumonia, unspecified organism: Principal | ICD-10-CM

## 2023-09-29 DIAGNOSIS — Z789 Other specified health status: Secondary | ICD-10-CM

## 2023-09-29 DIAGNOSIS — I10 Essential (primary) hypertension: Secondary | ICD-10-CM

## 2023-09-29 DIAGNOSIS — J9601 Acute respiratory failure with hypoxia: Secondary | ICD-10-CM

## 2023-09-29 DIAGNOSIS — E039 Hypothyroidism, unspecified: Secondary | ICD-10-CM

## 2023-09-29 DIAGNOSIS — Z79890 Hormone replacement therapy (postmenopausal): Secondary | ICD-10-CM

## 2023-09-29 DIAGNOSIS — Z9981 Dependence on supplemental oxygen: Secondary | ICD-10-CM

## 2023-09-29 DIAGNOSIS — Z91199 Patient's noncompliance with other medical treatment and regimen due to unspecified reason: Secondary | ICD-10-CM

## 2023-09-29 LAB — BLOOD GAS W/ LACTATE REFLEX
%FIO2 (ARTERIAL): 21 %
BASE DEFICIT: 2.7 mmol/L (ref 0.0–3.0)
BICARBONATE (ARTERIAL): 22.6 mmol/L (ref 21.0–28.0)
LACTATE: 1.2 mmol/L (ref <=1.9)
O2 SATURATION (ARTERIAL): 90.4 % (ref 94.0–98.0)
PAO2/FIO2 RATIO: 295
PCO2 (ARTERIAL): 40 mmHg (ref 35–45)
PH (ARTERIAL): 7.36 (ref 7.35–7.45)
PO2 (ARTERIAL): 62 mmHg — ABNORMAL LOW (ref 83–108)

## 2023-09-29 LAB — COMPREHENSIVE METABOLIC PANEL, NON-FASTING
ALBUMIN/GLOBULIN RATIO: 1.3 (ref 0.8–1.4)
ALBUMIN: 3.5 g/dL (ref 3.5–5.7)
ALKALINE PHOSPHATASE: 71 U/L (ref 34–104)
ALT (SGPT): 14 U/L (ref 7–52)
ANION GAP: 7 mmol/L (ref 4–13)
AST (SGOT): 17 U/L (ref 13–39)
BILIRUBIN TOTAL: 0.3 mg/dL (ref 0.3–1.0)
BUN/CREA RATIO: 9 (ref 6–22)
BUN: 10 mg/dL (ref 7–25)
CALCIUM, CORRECTED: 9.1 mg/dL (ref 8.9–10.8)
CALCIUM: 8.7 mg/dL (ref 8.6–10.3)
CHLORIDE: 108 mmol/L — ABNORMAL HIGH (ref 98–107)
CO2 TOTAL: 24 mmol/L (ref 21–31)
CREATININE: 1.13 mg/dL (ref 0.60–1.30)
ESTIMATED GFR: 55 mL/min/1.73mˆ2 — ABNORMAL LOW (ref >59)
GLOBULIN: 2.6 (ref 2.0–3.5)
GLUCOSE: 154 mg/dL — ABNORMAL HIGH (ref 74–109)
OSMOLALITY, CALCULATED: 280 mosm/kg (ref 270–290)
POTASSIUM: 3.7 mmol/L (ref 3.5–5.1)
PROTEIN TOTAL: 6.1 g/dL — ABNORMAL LOW (ref 6.4–8.9)
SODIUM: 139 mmol/L (ref 136–145)

## 2023-09-29 LAB — COVID-19, FLU A/B, RSV RAPID BY PCR
INFLUENZA VIRUS TYPE A: NOT DETECTED
INFLUENZA VIRUS TYPE B: NOT DETECTED
RESPIRATORY SYNCTIAL VIRUS (RSV): NOT DETECTED
SARS-CoV-2: DETECTED — AB

## 2023-09-29 LAB — CBC
HCT: 36 % (ref 31.2–41.9)
HGB: 12.4 g/dL (ref 10.9–14.3)
MCH: 29.2 pg (ref 24.7–32.8)
MCHC: 34.4 g/dL (ref 32.3–35.6)
MCV: 84.8 fL (ref 75.5–95.3)
MPV: 6.4 fL — ABNORMAL LOW (ref 7.9–10.8)
PLATELETS: 179 x10ˆ3/uL (ref 140–440)
RBC: 4.25 x10ˆ6/uL (ref 3.63–4.92)
RDW: 15.1 % (ref 12.3–17.7)
WBC: 4 x10ˆ3/uL (ref 3.8–11.8)

## 2023-09-29 LAB — RAPID THROAT SCREEN, STREPTOCOCCUS, WITH REFLEX: THROAT RAPID SCREEN, STREPTOCOCCUS: NEGATIVE

## 2023-09-29 LAB — POC BLOOD GLUCOSE (RESULTS): GLUCOSE, POC: 141 mg/dL — ABNORMAL HIGH (ref 70–100)

## 2023-09-29 LAB — C-REACTIVE PROTEIN (CRP): C-REACTIVE PROTEIN (CRP): 1.1 mg/dL — ABNORMAL HIGH (ref 0.1–0.5)

## 2023-09-29 LAB — B-TYPE NATRIURETIC PEPTIDE (BNP),PLASMA: BNP: 31 pg/mL (ref 1–100)

## 2023-09-29 MED ORDER — SUCRALFATE 1 GRAM TABLET
1.0000 g | ORAL_TABLET | Freq: Two times a day (BID) | ORAL | Status: DC
Start: 2023-09-30 — End: 2023-10-01
  Administered 2023-09-30 – 2023-10-01 (×3): 1 g via ORAL
  Filled 2023-09-29 (×3): qty 1

## 2023-09-29 MED ORDER — AZITHROMYCIN 250 MG TABLET
500.0000 mg | ORAL_TABLET | Freq: Every day | ORAL | Status: DC
Start: 2023-09-29 — End: 2023-10-01
  Administered 2023-09-29 – 2023-10-01 (×3): 500 mg via ORAL
  Filled 2023-09-29 (×2): qty 2

## 2023-09-29 MED ORDER — LAMOTRIGINE 100 MG TABLET
100.0000 mg | ORAL_TABLET | Freq: Two times a day (BID) | ORAL | Status: DC
Start: 2023-09-29 — End: 2023-10-01
  Administered 2023-09-29 – 2023-10-01 (×5): 100 mg via ORAL
  Filled 2023-09-29 (×4): qty 1

## 2023-09-29 MED ORDER — AMLODIPINE 5 MG TABLET
5.0000 mg | ORAL_TABLET | Freq: Every evening | ORAL | Status: DC
Start: 2023-09-29 — End: 2023-10-01
  Administered 2023-09-29 – 2023-09-30 (×2): 5 mg via ORAL
  Filled 2023-09-29 (×2): qty 1

## 2023-09-29 MED ORDER — DEXAMETHASONE 4 MG TABLET
6.0000 mg | ORAL_TABLET | Freq: Every day | ORAL | Status: DC
Start: 2023-09-29 — End: 2023-10-04
  Administered 2023-09-29 – 2023-10-01 (×3): 6 mg via ORAL
  Filled 2023-09-29 (×2): qty 2

## 2023-09-29 MED ORDER — METOPROLOL SUCCINATE ER 50 MG TABLET,EXTENDED RELEASE 24 HR
75.0000 mg | ORAL_TABLET | Freq: Two times a day (BID) | ORAL | Status: DC
Start: 2023-09-29 — End: 2023-10-01
  Administered 2023-09-29 – 2023-10-01 (×5): 75 mg via ORAL
  Filled 2023-09-29 (×4): qty 1

## 2023-09-29 MED ORDER — RISPERIDONE 2 MG TABLET
2.0000 mg | ORAL_TABLET | Freq: Every evening | ORAL | Status: DC
Start: 2023-09-29 — End: 2023-10-01
  Administered 2023-09-29 – 2023-09-30 (×2): 2 mg via ORAL
  Filled 2023-09-29 (×2): qty 1

## 2023-09-29 MED ORDER — ENOXAPARIN 40 MG/0.4 ML SUBCUTANEOUS SYRINGE
INJECTION | SUBCUTANEOUS | Status: AC
Start: 2023-09-29 — End: 2023-09-29
  Filled 2023-09-29: qty 0.4

## 2023-09-29 MED ORDER — CEFTRIAXONE 2 GRAM SOLUTION FOR INJECTION
INTRAMUSCULAR | Status: AC
Start: 2023-09-29 — End: 2023-09-29
  Filled 2023-09-29: qty 20

## 2023-09-29 MED ORDER — IPRATROPIUM 0.5 MG-ALBUTEROL 3 MG (2.5 MG BASE)/3 ML NEBULIZATION SOLN
3.0000 mL | INHALATION_SOLUTION | Freq: Four times a day (QID) | RESPIRATORY_TRACT | Status: DC
Start: 2023-09-29 — End: 2023-10-01
  Administered 2023-09-29: 3 mL via RESPIRATORY_TRACT
  Administered 2023-09-29 – 2023-09-30 (×3): 0 mL via RESPIRATORY_TRACT
  Administered 2023-09-30 (×2): 3 mL via RESPIRATORY_TRACT
  Filled 2023-09-29 (×2): qty 3

## 2023-09-29 MED ORDER — METOPROLOL SUCCINATE ER 25 MG TABLET,EXTENDED RELEASE 24 HR
ORAL_TABLET | ORAL | Status: AC
Start: 2023-09-29 — End: 2023-09-29
  Filled 2023-09-29: qty 1

## 2023-09-29 MED ORDER — TOPIRAMATE 25 MG TABLET
25.0000 mg | ORAL_TABLET | Freq: Two times a day (BID) | ORAL | Status: DC
Start: 2023-09-29 — End: 2023-10-01
  Administered 2023-09-29: 25 mg via ORAL
  Administered 2023-09-29: 0 mg via ORAL
  Administered 2023-09-30 – 2023-10-01 (×3): 25 mg via ORAL
  Filled 2023-09-29 (×6): qty 1

## 2023-09-29 MED ORDER — ALBUTEROL SULFATE 2.5 MG/3 ML (0.083 %) SOLUTION FOR NEBULIZATION
2.5000 mg | INHALATION_SOLUTION | RESPIRATORY_TRACT | Status: AC
Start: 2023-09-29 — End: 2023-09-29
  Administered 2023-09-29: 2.5 mg via RESPIRATORY_TRACT

## 2023-09-29 MED ORDER — LAMOTRIGINE 100 MG TABLET
ORAL_TABLET | ORAL | Status: AC
Start: 2023-09-29 — End: 2023-09-29
  Filled 2023-09-29: qty 1

## 2023-09-29 MED ORDER — DEXTROSE 50 % IN WATER (D50W) INTRAVENOUS SYRINGE
12.5000 g | INJECTION | INTRAVENOUS | Status: DC | PRN
Start: 2023-09-29 — End: 2023-10-01

## 2023-09-29 MED ORDER — ATORVASTATIN 40 MG TABLET
40.0000 mg | ORAL_TABLET | Freq: Every evening | ORAL | Status: DC
Start: 2023-09-29 — End: 2023-10-01
  Administered 2023-09-29 – 2023-09-30 (×2): 40 mg via ORAL
  Filled 2023-09-29 (×2): qty 1

## 2023-09-29 MED ORDER — AZITHROMYCIN 250 MG TABLET
ORAL_TABLET | ORAL | Status: AC
Start: 2023-09-29 — End: 2023-09-29
  Filled 2023-09-29: qty 2

## 2023-09-29 MED ORDER — SODIUM CHLORIDE 0.9 % INTRAVENOUS SOLUTION
INTRAVENOUS | Status: AC
Start: 2023-09-29 — End: 2023-09-29
  Filled 2023-09-29: qty 50

## 2023-09-29 MED ORDER — CHOLECALCIFEROL (VITAMIN D3) 25 MCG (1,000 UNIT) TABLET
ORAL_TABLET | ORAL | Status: AC
Start: 2023-09-29 — End: 2023-09-29
  Filled 2023-09-29: qty 1

## 2023-09-29 MED ORDER — INSULIN LISPRO 100 UNIT/ML SUB-Q - CHARGE BY DOSE
0.0000 [IU] | SUBCUTANEOUS | Status: DC | PRN
Start: 2023-09-29 — End: 2023-09-29

## 2023-09-29 MED ORDER — METOPROLOL SUCCINATE ER 50 MG TABLET,EXTENDED RELEASE 24 HR
ORAL_TABLET | ORAL | Status: AC
Start: 2023-09-29 — End: 2023-09-29
  Filled 2023-09-29: qty 1

## 2023-09-29 MED ORDER — SUCRALFATE 1 GRAM TABLET
1.0000 g | ORAL_TABLET | Freq: Two times a day (BID) | ORAL | Status: DC
Start: 2023-09-29 — End: 2023-09-29
  Administered 2023-09-29: 1 g via ORAL
  Administered 2023-09-29: 0 g via ORAL
  Filled 2023-09-29: qty 1

## 2023-09-29 MED ORDER — CHOLECALCIFEROL (VITAMIN D3) 25 MCG (1,000 UNIT) TABLET
1000.0000 [IU] | ORAL_TABLET | Freq: Every day | ORAL | Status: DC
Start: 2023-09-29 — End: 2023-10-01
  Administered 2023-09-29 – 2023-10-01 (×3): 1000 [IU] via ORAL
  Filled 2023-09-29 (×2): qty 1

## 2023-09-29 MED ORDER — PANTOPRAZOLE 40 MG TABLET,DELAYED RELEASE
40.0000 mg | DELAYED_RELEASE_TABLET | Freq: Every day | ORAL | Status: DC
Start: 2023-09-29 — End: 2023-10-01
  Administered 2023-09-29 – 2023-09-30 (×2): 40 mg via ORAL
  Filled 2023-09-29 (×3): qty 1

## 2023-09-29 MED ORDER — SODIUM CHLORIDE 0.9 % INTRAVENOUS SOLUTION
2.0000 g | INTRAVENOUS | Status: DC
Start: 2023-09-29 — End: 2023-10-01
  Administered 2023-09-29: 2 g via INTRAVENOUS
  Administered 2023-09-29 – 2023-09-30 (×2): 0 g via INTRAVENOUS
  Administered 2023-09-30: 2 g via INTRAVENOUS
  Filled 2023-09-29: qty 20

## 2023-09-29 MED ORDER — INSULIN GLARGINE 100 UNITS/ML SUBQ - CHARGE BY DOSE
30.0000 [IU] | Freq: Two times a day (BID) | SUBCUTANEOUS | Status: DC
Start: 2023-09-29 — End: 2023-10-01
  Administered 2023-09-29 – 2023-10-01 (×4): 30 [IU] via SUBCUTANEOUS
  Filled 2023-09-29 (×4): qty 30

## 2023-09-29 MED ORDER — DEXAMETHASONE 4 MG TABLET
ORAL_TABLET | ORAL | Status: AC
Start: 2023-09-29 — End: 2023-09-29
  Filled 2023-09-29: qty 2

## 2023-09-29 MED ORDER — DEXTROSE 40 % ORAL GEL
15.0000 g | ORAL | Status: DC | PRN
Start: 2023-09-29 — End: 2023-10-01

## 2023-09-29 MED ORDER — LEVOTHYROXINE 112 MCG TABLET
112.0000 ug | ORAL_TABLET | Freq: Every morning | ORAL | Status: DC
Start: 2023-09-29 — End: 2023-10-01
  Administered 2023-09-29: 0 ug via ORAL
  Administered 2023-09-30 – 2023-10-01 (×2): 112 ug via ORAL
  Filled 2023-09-29 (×4): qty 1

## 2023-09-29 MED ORDER — INSULIN LISPRO 100 UNIT/ML SUB-Q SSIP VIAL
3.0000 [IU] | INJECTION | Freq: Four times a day (QID) | SUBCUTANEOUS | Status: DC
Start: 2023-09-29 — End: 2023-10-01
  Administered 2023-09-29: 8 [IU] via SUBCUTANEOUS
  Administered 2023-09-29: 0 [IU] via SUBCUTANEOUS
  Administered 2023-09-30: 3 [IU] via SUBCUTANEOUS
  Administered 2023-09-30: 8 [IU] via SUBCUTANEOUS
  Administered 2023-09-30 (×2): 3 [IU] via SUBCUTANEOUS
  Administered 2023-10-01 (×2): 0 [IU] via SUBCUTANEOUS
  Filled 2023-09-29 (×3): qty 3
  Filled 2023-09-29: qty 6
  Filled 2023-09-29: qty 8

## 2023-09-29 MED ORDER — GLUCAGON HCL 1 MG/ML SOLUTION FOR INJECTION
1.0000 mg | Freq: Once | INTRAMUSCULAR | Status: DC | PRN
Start: 2023-09-29 — End: 2023-10-01

## 2023-09-29 MED ORDER — ENOXAPARIN 40 MG/0.4 ML SUBCUTANEOUS SYRINGE
40.0000 mg | INJECTION | Freq: Every day | SUBCUTANEOUS | Status: DC
Start: 2023-09-29 — End: 2023-10-01
  Administered 2023-09-29 – 2023-10-01 (×3): 0 mg via SUBCUTANEOUS
  Filled 2023-09-29 (×2): qty 0.4

## 2023-09-29 NOTE — ED Nurses Note (Signed)
 O2 removed from pt, walk test performed. Resp stayed even and unlabored O2 dropped to 81%. Provider informed

## 2023-09-29 NOTE — ED Triage Notes (Signed)
 Patient presents to the ED with c/o chest and nasal congestion x 1 week with sore throat. Patient reports being SOB when she walks and states  the roof of my mouth is really sore.

## 2023-09-29 NOTE — ED Nurses Note (Signed)
Report called to Marcus, RN.

## 2023-09-29 NOTE — ED Nurses Note (Signed)
 Pt c/o cough, and body aches x1 week. Started having SOB yesterday and decided to come into ER today. Pt is on 2L NC at night. Resp are slightly labored, color pink, skin w/d. Diminished in LUL, and pt states she no has a non productive cough.

## 2023-09-29 NOTE — H&P (Signed)
 [place] [facility]    HOSPITALIST H&P    Xoie Kreuser 63 y.o. female ED24/ED24   Date of Service: 09/29/2023    Date of Admission:  09/29/2023   PCP: Powell Ahle, PA-C Code Status:No Order       History Obtained From: Patient  Chief Complaint:  " Shortness of breath "    HPI:   Julie Cain is a 63 y.o., White female with a history of diabetes, hypertension, hypothyroid, bipolar disorder and sleep apnea who presents to ED with complaints of shortness of breath, sore throat and chest congestion x1 week.  She states is initially getting better and then started feeling more short of breath.  Her has been has been sick with similar symptoms.  She states she did not go to her primary care provider.  She has been taking over-the-counter medications.  She does has a history of sleep apnea with oxygen for nocturnal use.  She is noncompliant with her oxygen.  Her oxygen saturation was initially 88% on room air.  Respiratory 4 Plex was positive for COVID.  ABG on room air shows PO2 of 62.  Chest x-ray basilar atelectasis or airspace disease.  Oxygen saturation did drop to 81% with ambulation.  She denies fever chills nausea vomiting or diarrhea.  She will be admitted to the hospitalist service for COVID with hypoxia.            ED medications:   Medications Ordered/Administered in the ED   albuterol  (PROVENTIL ) 2.5 mg / 3 mL (0.083%) neb solution (2.5 mg Nebulization Given 09/29/23 1045)         PMHx:    Past Medical History:   Diagnosis Date    Bipolar disorder     Bipolar disorder, unspecified     GERD (gastroesophageal reflux disease)     HTN (hypertension)     Hyperlipidemia     Hypothyroidism     Miscarriage     x3    Tremor     Type 2 diabetes mellitus         PSHx:   Past Surgical History:   Procedure Laterality Date    COLONOSCOPY      ESOPHAGOGASTRODUODENOSCOPY      HX CATARACT REMOVAL      HX CHOLECYSTECTOMY      HX HERNIA REPAIR            Allergies:    Allergies[1] Social  History  Social History[2]    Family History  Family Medical History:    None            Home Meds:      Prior to Admission medications    Medication Sig Start Date End Date Taking? Authorizing Provider   amLODIPine  (NORVASC ) 5 mg Oral Tablet Take 1 Tablet (5 mg total) by mouth Every evening with dinner 06/23/21  Yes Provider, Historical   atorvastatin  (LIPITOR) 40 mg Oral Tablet Take 1 Tablet (40 mg total) by mouth Every night 06/08/21  Yes Provider, Historical   cholecalciferol , vitamin D3, 25 mcg (1,000 unit) Oral Tablet Take 1 Tablet (1,000 Units total) by mouth Daily   Yes Provider, Historical   famotidine (PEPCID) 20 mg Oral Tablet Take 1 Tablet (20 mg total) by mouth Daily  Patient not taking: Reported on 09/29/2023    Provider, Historical   insulin  aspart (NOVOLOG FLEXPEN U-100 INSULIN  SUBQ) Inject 10 Units under the skin Three times daily with meals   Yes Provider, Historical  lamoTRIgine  (LAMICTAL ) 100 mg Oral Tablet Take 1 Tablet (100 mg total) by mouth Twice daily Takes daily at noon and at bedtime 08/10/21  Yes Provider, Historical   levothyroxine  (SYNTHROID ) 112 mcg Oral Tablet 1 Tablet (112 mcg total) Every morning 06/27/21  Yes Provider, Historical   metoprolol  succinate (TOPROL -XL) 50 mg Oral Tablet Sustained Release 24 hr 1.5 Tablets (75 mg total) Twice daily 07/13/21  Yes Provider, Historical   omeprazole (PRILOSEC) 40 mg Oral Capsule, Delayed Release(E.C.) Take 1 Capsule (40 mg total) by mouth Once a day 06/16/21  Yes Provider, Historical   ONETOUCH VERIO TEST STRIPS Strip USE TO TEST BLOOD SUGAR TWICE DAILY 06/07/21  Yes Provider, Historical   risperiDONE  (RISPERDAL ) 2 mg Oral Tablet Take 1 Tablet (2 mg total) by mouth Every night 06/15/21  Yes Provider, Historical   semaglutide (OZEMPIC) 0.25 mg or 0.5 mg (2 mg/3 mL) Subcutaneous Pen Injector Inject 0.5 mg under the skin Every 7 days Takes weekly on Friday   Yes Provider, Historical   SEMGLEE ,INSULIN  GLARG-YFGN,PEN 100 unit/mL (3 mL) Subcutaneous Insulin   Pen 30 Units Twice daily 07/14/21  Yes Provider, Historical   sucralfate  (CARAFATE ) 1 gram Oral Tablet Take 1 Tablet (1 g total) by mouth Twice daily   Yes Provider, Historical   topiramate  (TOPAMAX ) 25 mg Oral Tablet Take 1 Tablet (25 mg total) by mouth Twice daily - in morning and at bedtime   Yes Provider, Historical   cholecalciferol , vitamin D3, 250 mcg (10,000 unit) Oral Capsule Take 4 Capsules (40,000 Units total) by mouth Every 3 days Unsure which days she takes this  09/29/23  Provider, Historical   HUMALOG  [facility] INSULIN  100 unit/mL Subcutaneous Insulin  Pen 10 Units Three times daily with meals 06/16/21 09/29/23  Provider, Historical   nitroGLYCERIN (NITROSTAT) 0.4 mg Sublingual Tablet, Sublingual 1 Tablet (0.4 mg total) Every 5 minutes as needed 05/30/21 09/29/23  Provider, Historical   risperiDONE  (RISPERDAL ) 0.5 mg Oral Tablet Take 1 Tablet (0.5 mg total) by mouth Every night 06/15/21 09/29/23  Provider, Historical          ROS:   Review of Systems   All other systems reviewed and are negative.         Physical:  Filed Vitals:    09/29/23 1215 09/29/23 1230 09/29/23 1245 09/29/23 1300   BP: 113/72 124/70 122/60 126/88   Pulse:       Resp:       Temp:       SpO2: 94% 94% 93% 97%      Physical Exam  Vitals reviewed.   Constitutional:       Appearance: She is obese.   HENT:      Head: Normocephalic and atraumatic.      Mouth/Throat:      Mouth: Mucous membranes are moist.      Pharynx: Oropharynx is clear.   Cardiovascular:      Rate and Rhythm: Normal rate and regular rhythm.   Pulmonary:      Effort: Pulmonary effort is normal.      Breath sounds: Rales present.      Comments: Diminished bases bilaterally  O2 2 L nasal cannula  Abdominal:      General: Bowel sounds are normal.   Skin:     General: Skin is warm and dry.   Neurological:      Mental Status: She is alert and oriented to person, place, and time.      Comments: Tremor of head/upper extremities  Psychiatric:         Mood and Affect: Mood normal.          Behavior: Behavior normal.         Thought Content: Thought content normal.         Judgment: Judgment normal.            Diagnostic studies:  Results for orders placed or performed during the hospital encounter of 09/29/23 (from the past 24 hours)   COVID-19, FLU A/B, RSV RAPID BY PCR   Result Value Ref Range    SARS-CoV-2 Detected (A) Not Detected    INFLUENZA VIRUS TYPE A Not Detected Not Detected    INFLUENZA VIRUS TYPE B Not Detected Not Detected    RESPIRATORY SYNCTIAL VIRUS (RSV) Not Detected Not Detected   RAPID THROAT SCREEN, STREPTOCOCCUS, WITH REFLEX    Specimen: Throat; Swab   Result Value Ref Range    THROAT RAPID SCREEN, STREPTOCOCCUS Negative Negative   BLOOD GAS W/ LACTATE REFLEX Arterial   Result Value Ref Range    %FIO2 (ARTERIAL) 21 %    PH (ARTERIAL) 7.36 7.35 - 7.45    PCO2 (ARTERIAL) 40 35 - 45 mm/Hg    PO2 (ARTERIAL) 62 (L) 83 - 108 mm/Hg    BICARBONATE (ARTERIAL) 22.6 21.0 - 28.0 mmol/L    BASE DEFICIT 2.7 0.0 - 3.0 mmol/L    PAO2/FIO2 RATIO 295     O2 SATURATION (ARTERIAL) 90.4 94.0 - 98.0 %    LACTATE 1.2 <=1.9 mmol/L   CBC   Result Value Ref Range    WBC 4.0 3.8 - 11.8 x10^3/uL    RBC 4.25 3.63 - 4.92 x10^6/uL    HGB 12.4 10.9 - 14.3 g/dL    HCT 63.9 68.7 - 58.0 %    MCV 84.8 75.5 - 95.3 fL    MCH 29.2 24.7 - 32.8 pg    MCHC 34.4 32.3 - 35.6 g/dL    RDW 84.8 87.6 - 82.2 %    PLATELETS 179 140 - 440 x10^3/uL    MPV 6.4 (L) 7.9 - 10.8 fL   COMPREHENSIVE METABOLIC PANEL, NON-FASTING   Result Value Ref Range    SODIUM 139 136 - 145 mmol/L    POTASSIUM 3.7 3.5 - 5.1 mmol/L    CHLORIDE 108 (H) 98 - 107 mmol/L    CO2 TOTAL 24 21 - 31 mmol/L    ANION GAP 7 4 - 13 mmol/L    BUN 10 7 - 25 mg/dL    CREATININE 8.86 9.39 - 1.30 mg/dL    BUN/CREA RATIO 9 6 - 22    ESTIMATED GFR 55 (L) >59 mL/min/1.78m^2    ALBUMIN 3.5 3.5 - 5.7 g/dL    CALCIUM 8.7 8.6 - 89.6 mg/dL    GLUCOSE 845 (H) 74 - 109 mg/dL    ALKALINE PHOSPHATASE 71 34 - 104 U/L    ALT (SGPT) 14 7 - 52 U/L    AST (SGOT) 17 13 - 39 U/L     BILIRUBIN TOTAL 0.3 0.3 - 1.0 mg/dL    PROTEIN TOTAL 6.1 (L) 6.4 - 8.9 g/dL    ALBUMIN/GLOBULIN RATIO 1.3 0.8 - 1.4    OSMOLALITY, CALCULATED 280 270 - 290 mOsm/kg    CALCIUM, CORRECTED 9.1 8.9 - 10.8 mg/dL    GLOBULIN 2.6 2.0 - 3.5   C-REACTIVE PROTEIN (CRP)   Result Value Ref Range    C-REACTIVE PROTEIN (CRP) 1.1 (H) 0.1 - 0.5 mg/dL  B-TYPE NATRIURETIC PEPTIDE (BNP),PLASMA   Result Value Ref Range    BNP 31 1 - 100 pg/mL   ECG 12 LEAD   Result Value Ref Range    Ventricular rate 89 BPM    Atrial Rate 89 BPM    PR Interval 170 ms    QRS Duration 74 ms    QT Interval 372 ms    QTC Calculation 452 ms    Calculated P Axis 54 degrees    Calculated R Axis 33 degrees    Calculated T Axis 39 degrees         EKG interpretation:   Normal sinus rhythm nonspecific STT wave changes  Ventricular rate 89 QTC 452      Assessments/Plan:  There are no active hospital problems to display for this patient.    COVID/acute hypoxic respiratory failure  - isolation precautions  - supplemental oxygen  - IV Rocephin   - IV azithromycin   - dexamethasone  6 mg p.o. daily x5 days  - neb treatments  - incentive spirometer      Diabetes mellitus type 2  - Accu-Chek with sliding scale coverage      Hypertension  - continue amlodipine , metoprolol  succinate    Tremor  - medication side effect versus Parkinson's    Bipolar disorder/depression  - continue Lamictal , risperidone     Hypothyroid  - continue levothyroxine     Hyperlipidemia  - continue atorvastatin       Further evaluation and management depending upon hospital course.    The Hospitalist personally evaluated and examined the patient in conjunction with the MLP and agree with the assessments, treatment plan and disposition of the patient as recorded by the [facility].      Code status: No Order    DVT/PE Prophylaxis:  Enoxaparin   Daily Orders (From admission, onward)      None          Anticoagulants (last 24 hours)       None             Diet: No diet orders on file    Disposition:  The patient is  currently acutely ill requiring treatment on the medical floor for COVID. Patient will be closely evaluated and monitored.        Verneita Montclair, APRN    [place] [facility] HOSPITALIST         [1]   Allergies  Allergen Reactions    Metformin Nausea/ Vomiting   [2]   Social History  Tobacco Use    Smoking status: Never    Smokeless tobacco: Never   Substance Use Topics    Alcohol use: Never    Drug use: Never

## 2023-09-29 NOTE — ED Provider Notes (Signed)
 [facility]  ED Primary Provider Note  Patient Name: Julie Cain  Patient Age: 63 y.o.  Date of Birth: 1960-12-27    Chief Complaint: Congestion and Shortness of Breath        History of Present Illness       Julie Cain is a 63 y.o. female who had concerns including Congestion and Shortness of Breath.  63 year old female patient presents to emergency department with complaint of nonproductive cough, sore throat and congestion x1 week.  Patient is uncertain if she has had a fever with these symptoms, patient denies any chills or body aches with symptoms.  Patient states she did start coughing yesterday evening and felt very short of breath.  Patient does experience some exertional dyspnea.    Patient denies any earaches, patient reports sore throat over the past week but that has gotten better.    Patient denies any chest pain, abdominal pain, dysuria hematuria, patient denies any diarrhea, changes in bowel movements stool color consistency.  Patient denies any musculoskeletal complaints.      Review of Systems     No other overt Review of Systems are noted to be positive except noted in the HPI.      Historical Data   History Reviewed This Encounter: Medical History  Surgical History  Family History  Social History      Physical Exam   ED Triage Vitals [09/29/23 0951]   BP (Non-Invasive) 116/66   Heart Rate 88   Respiratory Rate 18   Temperature (!) 35.7 C (96.3 F)   SpO2 (!) 88 %   Weight 97.1 kg (214 lb)   Height 1.575 m (5' 2)         Nursing notes reviewed for what could be assessed. Past Medical, Surgical, and Social history reviewed for what has been completed.     Constitutional: NAD. Well-Developed. Well Nourished.  Head: Normocephalic, atraumatic.  Mouth/Throat:  Pink and moist mild erythema noted posterior pharynx.  Eyes: EOM grossly intact, conjunctiva normal Pupils PERRL bilateral.  Ears:  Bilateral ear examination with otoscope finds gray tympanic  membrane no erythema noted the appropriate cone light bilateral.  Neck: Supple, No JVD, Trachea Midline no carotid bruits  Cardiovascular: Regular Rate and Rhythm, extremities well perfused.  S1-S2 heart sounds noted  Pulmonary/Chest: No respiratory distress. Lungs are symmetric to auscultation bilaterally.  Breath sounds rhonchi noted lower bases bilateral.  Abdominal: Soft, non-tender, non-distended. Non peritoneal, no rebound, no guarding.  MSK: No Lower Extremity Edema. Moving all extremities with equal strength and purpose   Skin: Warm, dry, and normal.  Neuro: Appropriate, CN II-XII grossly intact. Gait normal  Psych: Pleasant     Procedures      Patient Data     Labs Ordered/Reviewed   COVID-19, FLU A/B, RSV RAPID BY PCR - Abnormal; Notable for the following components:       Result Value    SARS-CoV-2 Detected (*)     All other components within normal limits    Narrative:     Results are for the simultaneous qualitative identification of SARS-CoV-2 (formerly 2019-nCoV), Influenza A, Influenza B, and RSV RNA. These etiologic agents are generally detectable in nasopharyngeal and nasal swabs during the ACUTE PHASE of infection. Hence, this test is intended to be performed on respiratory specimens collected from individuals with signs and symptoms of upper respiratory tract infection who meet Centers for Disease Control and Prevention (CDC) clinical and/or epidemiological criteria for Coronavirus Disease 2019 (  COVID-19) testing. CDC COVID-19 criteria for testing on human specimens is available at [facility] webpage information for Healthcare Professionals: Coronavirus Disease 2019 (COVID-19) ([email or web address]).     False-negative results may occur if the virus has genomic mutations, insertions, deletions, or rearrangements or if performed very early in the course of illness. Otherwise, negative results indicate virus specific RNA targets are not detected, however negative  results do not preclude SARS-CoV-2 infection/COVID-19, Influenza, or Respiratory syncytial virus infection. Results should not be used as the sole basis for patient management decisions. Negative results must be combined with clinical observations, patient history, and epidemiological information. If upper respiratory tract infection is still suspected based on exposure history together with other clinical findings, re-testing should be considered.    Test methodology:   Cepheid Xpert Xpress SARS-CoV-2/Flu/RSV Assay real-time polymerase chain reaction (RT-PCR) test on the GeneXpert Dx and Xpert Xpress systems.   BLOOD GAS W/ LACTATE REFLEX - Abnormal; Notable for the following components:    PO2 (ARTERIAL) 62 (*)     All other components within normal limits    Narrative:     A reference range for Oxygen Saturation is provided but abnormal results will not flag in Epic.   CBC - Abnormal; Notable for the following components:    MPV 6.4 (*)     All other components within normal limits   COMPREHENSIVE METABOLIC PANEL, NON-FASTING - Abnormal; Notable for the following components:    CHLORIDE 108 (*)     ESTIMATED GFR 55 (*)     GLUCOSE 154 (*)     PROTEIN TOTAL 6.1 (*)     All other components within normal limits    Narrative:     Estimated Glomerular Filtration Rate (eGFR) is calculated using the CKD-EPI (2021) equation, intended for patients 71 years of age and older. If gender is not documented or unknown, there will be no eGFR calculation.     C-REACTIVE PROTEIN (CRP) - Abnormal; Notable for the following components:    C-REACTIVE PROTEIN (CRP) 1.1 (*)     All other components within normal limits   RAPID THROAT SCREEN, STREPTOCOCCUS, WITH REFLEX - Normal    Narrative:     Walk-Away Mode   B-TYPE NATRIURETIC PEPTIDE (BNP),PLASMA - Normal    Narrative:                                 Class 1: 101-250 pg/mL                              Class 2: 251-550 pg/mL                              Class 3: 551-900 pg/mL                               Class 4: >901 pg/mL     The [place]  [employer] has developed a four-stage functional classification system for CHF that is based on a subjective interpretation of the severity of a patient's clinical signs and symptoms.    Class 1 - Patients have no limitations on physical activity and have no symptoms with ordinary physical activity.    Class 2 - Patients have a slight limitation of physical activity and have symptoms with  ordinary physical activity.    Class 3 - Patients have a marked limitation of physical activity and have symptoms with less than ordinary physical activity, but not at rest.    Class 4 - Patients are unable to perform any physical activity without discomfort.   THROAT CULTURE, BETA HEMOLYTIC STREPTOCOCCUS       XR CHEST PA AND LATERAL   Final Result by Edi, Radresults In (08/24 1133)   Basilar atelectasis or airspace disease.               Radiologist location ID: [id number]             Medical Decision Making          Medical Decision Making  Amount and/or Complexity of Data Reviewed  Labs: ordered.  Radiology: ordered.  ECG/medicine tests: ordered.    Risk  Prescription drug management.  Decision regarding hospitalization.            Studies Assessed:  CBC, CMP, ABG, chest x-ray    EKG:   This independent EKG interpretation by that has noted below, this will be reviewed and documented separately by a cardiologist in the EMR.:    Rate:     Interpretation:       MDM Narrative:  62 year old female patient presents to emergency department with complaint of nonproductive cough shortness of breath and sore throat times 1 week.    Physical exam and subjective interview patient is awake, alert, oriented x4.  During physical exam I did note that patient's SpO2 level was 87-88 while talking to me.  Appropriate pleth wave for SpO2 observed on monitor during these times patient's SpO2 was 87 88%.    Patient has scattered rhonchi in the lower bases bilateral auscultation.   Patient is unsure she has had a fever over the past week, reports no chills or muscle aches.    Patient does report she is supposed to wear oxygen at night however she does not.    ABG on room air finds pH of 7.36, pCO2 is 40 PO2 62 bicarbonate is 22.6, base deficit 2.7, lactate at 1.2 oxygen saturation 90.4.    Chest x-ray notes basilar atelectasis, patient from bed walking on the unit without oxygen patient saturation noted to dropped to 81%.  I have concern that patient's clinical evaluation in diagnosis can include pneumonia associated with COVID-19 infection.    We will discuss this clinical case with hospital team for admission.        Differential includes but not limited to:  COVID  Flu  RSV  Strep pharyngitis  Pneumonia  Bronchitis        Follow-Up Discussion:     Please see documentation above for specific labs and radiology.    Decision for and Complexity of Risk in the ED encounter:  I ordered prescription medications requiring authorization in the ED or at discharge.  I decided when to consult for hospitalization or escalation of hospital-level care.              Medications Ordered/Administered in the ED   albuterol  (PROVENTIL ) 2.5 mg / 3 mL (0.083%) neb solution (2.5 mg Nebulization Given 09/29/23 1045)       Patient will be admitted to the  service for further workup and management.    Disposition: Admitted        Risk as noted in the medical decision-making is in reference to potential morbidity/mortality of management based upon previously established billing guidelines.  Based upon the clinical setting, the likely diagnosis/impression include:    Clinical Impression   Pneumonia (Primary)   COVID   Hypoxia   Cough, unspecified type         Current Discharge Medication List        This note was partially created using voice recognition software and is inherently subject to errors including those of syntax and sound alike  substitutions which may escape proof reading. In such instances, original  meaning may be extrapolated by contextual derivation.

## 2023-09-29 NOTE — Care Plan (Signed)
 [facility]  Care Plan Note    Situation: Pt. Admitted for covid.    Intervention: Monitor vitals and labs, monitor and manage pain, assist with ADLs, fall/isolation precautions initiated, oxygen therapy as indicated, admission education provided, and discharge planning ongoing.    Response: Pt. Is calm and cooperative at this time.      Henretta Baptist, RN      Problem: Adult Inpatient Plan of Care  Goal: Plan of Care Review  09/29/2023 1824 by Henretta DEL, RN  Outcome: Ongoing (see interventions/notes)  09/29/2023 1820 by Henretta DEL, RN  Outcome: Ongoing (see interventions/notes)  Goal: Patient-Specific Goal (Individualized)  09/29/2023 1824 by Henretta DEL, RN  Outcome: Ongoing (see interventions/notes)  09/29/2023 1820 by Henretta DEL, RN  Outcome: Ongoing (see interventions/notes)  Flowsheets (Taken 09/29/2023 1715)  Individualized Care Needs: monitor vitals and labs, monitor and manage pain, assist with ADLs  Anxieties, Fears or Concerns: pt. denied any at this time  Goal: Absence of Hospital-Acquired Illness or Injury  09/29/2023 1824 by Henretta DEL, RN  Outcome: Ongoing (see interventions/notes)  09/29/2023 1820 by Henretta DEL, RN  Outcome: Ongoing (see interventions/notes)  Intervention: Identify and Manage Fall Risk  Recent Flowsheet Documentation  Taken 09/29/2023 1715 by Henretta DEL, RN  Safety Promotion/Fall Prevention:   activity supervised   fall prevention program maintained   nonskid shoes/slippers when out of bed   safety round/check completed  Intervention: Prevent and Manage VTE (Venous Thromboembolism) Risk  Recent Flowsheet Documentation  Taken 09/29/2023 1715 by Henretta DEL, RN  VTE Prevention/Management:   ambulation promoted   dorsiflexion/plantar flexion performed  Goal: Optimal Comfort and Wellbeing  09/29/2023 1824 by Henretta DEL, RN  Outcome: Ongoing (see interventions/notes)  09/29/2023 1820 by Henretta DEL, RN  Outcome: Ongoing (see interventions/notes)  Goal: Rounds/Family Conference  09/29/2023 1824 by  Henretta DEL, RN  Outcome: Ongoing (see interventions/notes)  09/29/2023 1820 by Henretta DEL, RN  Outcome: Ongoing (see interventions/notes)     Problem: Health Knowledge, Opportunity to Enhance (Adult,Obstetrics,Pediatric)  Goal: Knowledgeable about Health Subject/Topic  Description: Patient will demonstrate the desired outcomes by discharge/transition of care.  09/29/2023 1824 by Henretta DEL, RN  Outcome: Ongoing (see interventions/notes)  09/29/2023 1820 by Henretta DEL, RN  Outcome: Ongoing (see interventions/notes)     Problem: Health Knowledge, Opportunity to Enhance (Adult,Obstetrics,Pediatric)  Goal: Knowledgeable about Health Subject/Topic  Description: Patient will demonstrate the desired outcomes by discharge/transition of care.  Outcome: Ongoing (see interventions/notes)     Problem: Fall Injury Risk  Goal: Absence of Fall and Fall-Related Injury  09/29/2023 1824 by Henretta DEL, RN  Outcome: Ongoing (see interventions/notes)  09/29/2023 1820 by Henretta DEL, RN  Outcome: Ongoing (see interventions/notes)  Intervention: Promote Injury-Free Environment  Recent Flowsheet Documentation  Taken 09/29/2023 1715 by Henretta DEL, RN  Safety Promotion/Fall Prevention:   activity supervised   fall prevention program maintained   nonskid shoes/slippers when out of bed   safety round/check completed     Problem: Gas Exchange Impaired  Goal: Optimal Gas Exchange  Outcome: Ongoing (see interventions/notes)     Problem: Skin Injury Risk Increased  Goal: Skin Health and Integrity  Outcome: Ongoing (see interventions/notes)     Problem: Pain Acute  Goal: Optimal Pain Control and Function  Outcome: Ongoing (see interventions/notes)     Problem: Covid-19 confirmed or rule-out  Description: Patient requires specific care to avoid complications secondary to COVID-19  Goal: Patient will remain free of complications due to COVID-19  Description: 1. Review and  update the patient's isolation status in the Isolation activity.2. Keep the patient's door  closed at all times and limit movement of the patient outside of the room to medically essential purposes. If applicable, transfer patient to a single-person room. 3. Educate and reinforce infection prevention and control practices recommended by CDC.4. Frequently monitor for development of more severe symptoms.5. Use appropriate PPE when providing care for patient.6. Reinforce current visitation policy and adherence to proper hand hygiene and PPE.7. Avoid procedures that are likely to induce coughing (e.g., sputum induction, open suctioning of airways). If required, do so in a Airborne Infection Isolation Room. The health care provider in the room should wear an N95 or higher-level respirator, eye protection, gloves, and a gown. The number of HCP present during the procedure should be limited to only those essential for patient care and procedure support. Visitors should not be present for the procedure. Clean and disinfect procedure room surfaces promptly.8. Update patient, family, and patient representatives as needed.  Outcome: Ongoing (see interventions/notes)     Problem: Discharge Needs Assessment  Goal: Discharge Needs Assessment  Outcome: Ongoing (see interventions/notes)

## 2023-09-30 ENCOUNTER — Inpatient Hospital Stay (HOSPITAL_COMMUNITY)

## 2023-09-30 DIAGNOSIS — U071 COVID-19: Principal | ICD-10-CM

## 2023-09-30 LAB — COMPREHENSIVE METABOLIC PANEL, NON-FASTING
ALBUMIN/GLOBULIN RATIO: 1.3 (ref 0.8–1.4)
ALBUMIN: 3.5 g/dL (ref 3.5–5.7)
ALKALINE PHOSPHATASE: 63 U/L (ref 34–104)
ALT (SGPT): 13 U/L (ref 7–52)
ANION GAP: 6 mmol/L (ref 4–13)
AST (SGOT): 13 U/L (ref 13–39)
BILIRUBIN TOTAL: 0.3 mg/dL (ref 0.3–1.0)
BUN/CREA RATIO: 14 (ref 6–22)
BUN: 14 mg/dL (ref 7–25)
CALCIUM, CORRECTED: 9.4 mg/dL (ref 8.9–10.8)
CALCIUM: 9 mg/dL (ref 8.6–10.3)
CHLORIDE: 107 mmol/L (ref 98–107)
CO2 TOTAL: 24 mmol/L (ref 21–31)
CREATININE: 1.01 mg/dL (ref 0.60–1.30)
ESTIMATED GFR: 63 mL/min/1.73mˆ2 (ref >59)
GLOBULIN: 2.8 (ref 2.0–3.5)
GLUCOSE: 192 mg/dL — ABNORMAL HIGH (ref 74–109)
OSMOLALITY, CALCULATED: 280 mosm/kg (ref 270–290)
POTASSIUM: 4.3 mmol/L (ref 3.5–5.1)
PROTEIN TOTAL: 6.3 g/dL — ABNORMAL LOW (ref 6.4–8.9)
SODIUM: 137 mmol/L (ref 136–145)

## 2023-09-30 LAB — CBC WITH DIFF
BASOPHIL #: 0 x10ˆ3/uL (ref 0.00–0.10)
BASOPHIL %: 1 % (ref 0–1)
EOSINOPHIL #: 0 x10ˆ3/uL (ref 0.00–0.50)
EOSINOPHIL %: 0 % — ABNORMAL LOW (ref 1–7)
HCT: 35.1 % (ref 31.2–41.9)
HGB: 12.1 g/dL (ref 10.9–14.3)
LYMPHOCYTE #: 0.5 x10ˆ3/uL — ABNORMAL LOW (ref 1.10–3.10)
LYMPHOCYTE %: 19 % (ref 16–46)
MCH: 28.9 pg (ref 24.7–32.8)
MCHC: 34.6 g/dL (ref 32.3–35.6)
MCV: 83.6 fL (ref 75.5–95.3)
MONOCYTE #: 0.2 x10ˆ3/uL (ref 0.20–0.90)
MONOCYTE %: 10 % (ref 4–11)
MPV: 6.4 fL — ABNORMAL LOW (ref 7.9–10.8)
NEUTROPHIL #: 1.9 x10ˆ3/uL (ref 1.90–8.20)
NEUTROPHIL %: 71 % (ref 43–77)
PLATELETS: 188 x10ˆ3/uL (ref 140–440)
RBC: 4.2 x10ˆ6/uL (ref 3.63–4.92)
RDW: 15.3 % (ref 12.3–17.7)
WBC: 2.6 x10ˆ3/uL — ABNORMAL LOW (ref 3.8–11.8)

## 2023-09-30 LAB — POC BLOOD GLUCOSE (RESULTS)
GLUCOSE, POC: 166 mg/dL — ABNORMAL HIGH (ref 70–100)
GLUCOSE, POC: 159 mg/dL — ABNORMAL HIGH (ref 70–100)
GLUCOSE, POC: 193 mg/dL — ABNORMAL HIGH (ref 70–100)
GLUCOSE, POC: 253 mg/dL — ABNORMAL HIGH (ref 70–100)

## 2023-09-30 LAB — MAGNESIUM: MAGNESIUM: 2.1 mg/dL (ref 1.9–2.7)

## 2023-09-30 NOTE — Progress Notes (Signed)
 Julie Cain               IP PROGRESS NOTE      Julie Cain  Date of Admission:  09/29/2023  Date of Birth:  1960-02-21  Date of Service:  09/30/2023    Hospital Day:  LOS: 1 day       Subjective:   Julie Cain is a 63 y.o. female who is seen and examined for COVID.  She denies new complaints today.  She remains on 2 L nasal cannula.  She is less of a cough today.  No fever chills nausea or vomiting.      Vital Signs:  Temp (24hrs) Max:37.1 C (98.8 F)      Temperature: 36.4 C (97.6 F)  BP (Non-Invasive): 135/76  MAP (Non-Invasive): 90 mmHG  Heart Rate: 85  Respiratory Rate: 18  SpO2: 91 %    Current Medications:  amLODIPine  (NORVASC ) tablet, 5 mg, Oral, Daily with Dinner  atorvastatin  (LIPITOR) tablet, 40 mg, Oral, NIGHTLY  azithromycin  (ZITHROMAX ) tablet, 500 mg, Oral, Daily  cefTRIAXone  (ROCEPHIN ) 2 g in NS 50 mL IVPB with adaptor, 2 g, Intravenous, Q24H  cholecalciferol  (VITAMIN D3) 1000 unit (25 mcg) tablet, 1,000 Units, Oral, Daily  Correction/SSIP insulin  lispro 100 units/mL injection, 3-14 Units, Subcutaneous, 4x/day AC  dexAMETHasone  (DECADRON ) tablet, 6 mg, Oral, Daily  dextrose  (GLUTOSE) 40% oral gel, 15 g, Oral, Q15 Min PRN  dextrose  50% (0.5 g/mL) injection - syringe, 12.5 g, Intravenous, Q15 Min PRN  enoxaparin  PF (LOVENOX ) 40 mg/0.4 mL SubQ injection, 40 mg, Subcutaneous, Daily  glucagon  injection 1 mg, 1 mg, IntraMUSCULAR, Once PRN  insulin  glargine 100 units/mL injection, 30 Units, Subcutaneous, 2x/day  ipratropium-albuterol  0.5 mg-3 mg(2.5 mg base)/3 mL Solution for Nebulization, 3 mL, Nebulization, 4x/day  lamoTRIgine  (LAMICTAL ) tablet, 100 mg, Oral, 2x/day  levothyroxine  (SYNTHROID ) tablet, 112 mcg, Oral, QAM  metoprolol  succinate (TOPROL -XL) 24 hr extended release tablet 75 mg, 75 mg, Oral, 2x/day  pantoprazole  (PROTONIX ) delayed release tablet, 40 mg, Oral, Daily  risperiDONE  (risperDAL ) tablet, 2 mg, Oral, NIGHTLY  sucralfate  (CARAFATE ) tablet, 1 g, Oral, 2x/day  AC  topiramate  (TOPAMAX ) tablet, 25 mg, Oral, 2x/day        Current Orders:  Active Orders   Microbiology    LEGIONELLA URINE ANTIGEN     Frequency: ONE TIME     Number of Occurrences: 1 Occurrences    RESPIRATORY CULTURE AND GRAM STAIN, AEROBIC     Frequency: ONE TIME     Number of Occurrences: 1 Occurrences    SPUTUM SCREEN     Frequency: Once     Number of Occurrences: 1 Occurrences    STREPTOCOCCUS PNEUMONIAE ANTIGEN,URINE     Frequency: ONE TIME     Number of Occurrences: 1 Occurrences   Imaging    CT CHEST WO IV CONTRAST     Frequency: ONE TIME     Number of Occurrences: 1 Occurrences     Scheduling Instructions:      Isolation Status:  Enhanced Droplet         Lab    CBC/DIFF     Frequency: ONE TIME     Number of Occurrences: 1 Occurrences    CBC/DIFF     Frequency: 0530 - AM DRAW     Number of Occurrences: 1 Occurrences    COMPREHENSIVE METABOLIC PANEL, NON-FASTING     Frequency: ONE TIME     Number of Occurrences: 1 Occurrences    COMPREHENSIVE METABOLIC PANEL, NON-FASTING  Frequency: 0530 - AM DRAW     Number of Occurrences: 1 Occurrences    MAGNESIUM     Frequency: ONE TIME     Number of Occurrences: 1 Occurrences    MAGNESIUM     Frequency: 0530 - AM DRAW     Number of Occurrences: 1 Occurrences   Diet    DIET DIABETIC Calorie amount: CC 2200; Do you want to initiate MNT Protocol? Yes     Frequency: All Meals     Number of Occurrences: 1 Occurrences   Nursing    ACTIVITY     Frequency: UNTIL DISCONTINUED     Number of Occurrences: Until Specified    HYPOGLYCEMIA MANAGEMENT - CONSCIOUS PATIENT W/DIET ORDER     Frequency: UNTIL DISCONTINUED     Number of Occurrences: Until Specified    HYPOGLYCEMIA MANAGEMENT - UNCONSCIOUS/ALTERED/NPO PATIENT     Frequency: UNTIL DISCONTINUED     Number of Occurrences: Until Specified    HYPOGLYCEMIA TREATMENT ALGORITHM     Frequency: UNTIL DISCONTINUED     Number of Occurrences: Until Specified    INTAKE AND OUTPUT QSHIFT     Frequency: QSHIFT     Number of  Occurrences: Until Specified    TELEMETRY MONITORING - Continuous     Frequency: CONTINUOUS     Number of Occurrences: Until Specified    VITAL SIGNS  Q4H     Frequency: Q4H     Number of Occurrences: Until Specified   Code Status    DO NOT ATTEMPT RESUSCITATION (DNAR/DNI/DNR-CC)     Frequency: CONTINUOUS     Number of Occurrences: Until Specified     Order Comments: Do not use mechanical ventilation or intubation in the event of respiratory failure or other medication indication. May consider use of less invasive airway support such as BiPAP.    In the event of a pulseless cardiac arrest, do NOT perform ACLS (advanced cardiac life support) to attempt resuscitation. IE - DO NOT perform chest compressions/defibrillation or use mechanical ventilation or intubation in the setting of pulseless cardiac arrest.       Isolation    ENHANCED DROPLET ISOLATION     Frequency: CONTINUOUS     Number of Occurrences: Until Specified     Order Comments: Private room  Wear a fitted N95/MSA/CAPR when entering room  Wear gown, gloves, and eye protection as indicated  Prior to transport, notify receiving department of precautions  Prior to transport, ensure patient is wearing a mask and follows Respiratory Hygiene/Cough Etiquette         Respiratory Care    ACCUPAP SYSTEM     Frequency: Q4H WHILE AWAKE     Number of Occurrences: Until Specified   Point of Care Testing    PERFORM POC WHOLE BLOOD GLUCOSE     Frequency: TID AC & HS     Number of Occurrences: Until Specified   Medications    amLODIPine  (NORVASC ) tablet     Frequency: Daily with Dinner     Dose: 5 mg     Route: Oral    atorvastatin  (LIPITOR) tablet     Frequency: NIGHTLY     Dose: 40 mg     Route: Oral    azithromycin  (ZITHROMAX ) tablet     Frequency: Daily     Dose: 500 mg     Route: Oral    cefTRIAXone  (ROCEPHIN ) 2 g in NS 50 mL IVPB with adaptor     Frequency: Q24H  Dose: 2 g     Route: Intravenous    cholecalciferol  (VITAMIN D3) 1000 unit (25 mcg) tablet      Frequency: Daily     Dose: 1,000 Units     Route: Oral    Correction/SSIP insulin  lispro 100 units/mL injection     Frequency: 4x/day AC     Dose: 3-14 Units     Route: Subcutaneous    dexAMETHasone  (DECADRON ) tablet     Frequency: Daily     Dose: 6 mg     Route: Oral    dextrose  (GLUTOSE) 40% oral gel     Frequency: Q15 Min PRN     Dose: 15 g     Route: Oral    dextrose  50% (0.5 g/mL) injection - syringe     Frequency: Q15 Min PRN     Dose: 12.5 g     Route: Intravenous    enoxaparin  PF (LOVENOX ) 40 mg/0.4 mL SubQ injection     Frequency: Daily     Dose: 40 mg     Route: Subcutaneous    glucagon  injection 1 mg     Frequency: Once PRN     Dose: 1 mg     Route: IntraMUSCULAR    insulin  glargine 100 units/mL injection     Frequency: 2x/day     Dose: 30 Units     Route: Subcutaneous    ipratropium-albuterol  0.5 mg-3 mg(2.5 mg base)/3 mL Solution for Nebulization     Frequency: 4x/day     Dose: 3 mL     Route: Nebulization    lamoTRIgine  (LAMICTAL ) tablet     Frequency: 2x/day     Dose: 100 mg     Route: Oral    levothyroxine  (SYNTHROID ) tablet     Frequency: QAM     Dose: 112 mcg     Route: Oral    metoprolol  succinate (TOPROL -XL) 24 hr extended release tablet 75 mg     Frequency: 2x/day     Dose: 75 mg     Route: Oral    pantoprazole  (PROTONIX ) delayed release tablet     Frequency: Daily     Dose: 40 mg     Route: Oral    risperiDONE  (risperDAL ) tablet     Frequency: NIGHTLY     Dose: 2 mg     Route: Oral    sucralfate  (CARAFATE ) tablet     Frequency: 2x/day AC     Dose: 1 g     Route: Oral    topiramate  (TOPAMAX ) tablet     Frequency: 2x/day     Dose: 25 mg     Route: Oral        Review of Systems:  Focused review of system was completed. Refer to the HPI for ROS details.     Today's Physical Exam:  Physical Exam  Vitals reviewed.   Constitutional:       Appearance: She is obese.   HENT:      Head: Normocephalic and atraumatic.   Cardiovascular:      Rate and Rhythm: Normal rate and regular rhythm.      Pulses:  Normal pulses.      Heart sounds: Normal heart sounds.   Pulmonary:      Effort: Pulmonary effort is normal.      Breath sounds: Normal breath sounds.      Comments: O2 2 L nasal cannula  Abdominal:      General: Bowel sounds are normal.  Palpations: Abdomen is soft.   Skin:     General: Skin is warm and dry.      Capillary Refill: Capillary refill takes less than 2 seconds.   Neurological:      General: No focal deficit present.      Mental Status: She is alert and oriented to person, place, and time.   Psychiatric:         Mood and Affect: Mood normal.         Behavior: Behavior normal.         Thought Content: Thought content normal.         Judgment: Judgment normal.          I/O:  I/O last 24 hours:    Intake/Output Summary (Last 24 hours) at 09/30/2023 1307  Last data filed at 09/30/2023 1000  Gross per 24 hour   Intake 220 ml   Output --   Net 220 ml     I/O current shift:  08/25 0700 - 08/25 1859  In: 220 [P.O.:220]  Out: -     Labs:  Reviewed: I have reviewed all lab results.  Lab Results Today:    Results for orders placed or performed during the hospital encounter of 09/29/23 (from the past 24 hours)   POC BLOOD GLUCOSE (RESULTS)   Result Value Ref Range    GLUCOSE, POC 141 (H) 70 - 100 mg/dl   COMPREHENSIVE METABOLIC PANEL, NON-FASTING   Result Value Ref Range    SODIUM 137 136 - 145 mmol/L    POTASSIUM 4.3 3.5 - 5.1 mmol/L    CHLORIDE 107 98 - 107 mmol/L    CO2 TOTAL 24 21 - 31 mmol/L    ANION GAP 6 4 - 13 mmol/L    BUN 14 7 - 25 mg/dL    CREATININE 8.98 9.39 - 1.30 mg/dL    BUN/CREA RATIO 14 6 - 22    ESTIMATED GFR 63 >59 mL/min/1.16m2    ALBUMIN 3.5 3.5 - 5.7 g/dL    CALCIUM 9.0 8.6 - 89.6 mg/dL    GLUCOSE 807 (H) 74 - 109 mg/dL    ALKALINE PHOSPHATASE 63 34 - 104 U/L    ALT (SGPT) 13 7 - 52 U/L    AST (SGOT) 13 13 - 39 U/L    BILIRUBIN TOTAL 0.3 0.3 - 1.0 mg/dL    PROTEIN TOTAL 6.3 (L) 6.4 - 8.9 g/dL    ALBUMIN/GLOBULIN RATIO 1.3 0.8 - 1.4    OSMOLALITY, CALCULATED 280 270 - 290 mOsm/kg     CALCIUM, CORRECTED 9.4 8.9 - 10.8 mg/dL    GLOBULIN 2.8 2.0 - 3.5   MAGNESIUM   Result Value Ref Range    MAGNESIUM 2.1 1.9 - 2.7 mg/dL   CBC WITH DIFF   Result Value Ref Range    WBC 2.6 (L) 3.8 - 11.8 x103/uL    RBC 4.20 3.63 - 4.92 x106/uL    HGB 12.1 10.9 - 14.3 g/dL    HCT 64.8 68.7 - 58.0 %    MCV 83.6 75.5 - 95.3 fL    MCH 28.9 24.7 - 32.8 pg    MCHC 34.6 32.3 - 35.6 g/dL    RDW 84.6 87.6 - 82.2 %    PLATELETS 188 140 - 440 x103/uL    MPV 6.4 (L) 7.9 - 10.8 fL    NEUTROPHIL % 71 43 - 77 %    LYMPHOCYTE % 19 16 - 46 %    MONOCYTE %  10 4 - 11 %    EOSINOPHIL % 0 (L) 1 - 7 %    BASOPHIL % 1 0 - 1 %    NEUTROPHIL # 1.90 1.90 - 8.20 x103/uL    LYMPHOCYTE # 0.50 (L) 1.10 - 3.10 x103/uL    MONOCYTE # 0.20 0.20 - 0.90 x103/uL    EOSINOPHIL # 0.00 0.00 - 0.50 x103/uL    BASOPHIL # 0.00 0.00 - 0.10 x103/uL   POC BLOOD GLUCOSE (RESULTS)   Result Value Ref Range    GLUCOSE, POC 166 (H) 70 - 100 mg/dl   POC BLOOD GLUCOSE (RESULTS)   Result Value Ref Range    GLUCOSE, POC 159 (H) 70 - 100 mg/dl     Micro Results:   Hospital Encounter on 09/29/23 (from the past 24 hours)   RESPIRATORY CULTURE AND GRAM STAIN, AEROBIC    Collection Time: 09/30/23 10:43 AM    Specimen: Sputum; Other    Narrative    The following orders were created for panel order RESPIRATORY CULTURE AND GRAM STAIN, AEROBIC.  Procedure                               Abnormality         Status                     ---------                               -----------         ------                     SPUTUM [id number]]                                                                 Please view results for these tests on the individual orders.     Images:   XR CHEST PA AND LATERAL  Result Date: 09/29/2023  Impression Basilar atelectasis or airspace disease. Radiologist location ID: [id number]      XR CHEST PA AND LATERAL   Final Result   Basilar atelectasis or airspace disease.               Radiologist location ID: [id number]         CT CHEST WO  IV CONTRAST    (Results Pending)        Problem List:  Active Hospital Problems   (*Primary Problem)    Diagnosis    *COVID       Nutrition:    DIET DIABETIC Calorie amount: CC 2200; Do you want to initiate MNT Protocol? Yes    Additional clinical characteristics related to nutrition:    - monitor for weight changes   - monitor intake and output    - monitor bowel functions                    Assessment/ Plan:   COVID/acute hypoxic respiratory failure  - isolation precautions  - supplemental oxygen  - IV Rocephin   - IV azithromycin   - dexamethasone  6 mg p.o. daily x5 days  -  neb treatments  - incentive spirometer  - CT of the chest without contrast      Diabetes mellitus type 2  - Accu-Chek with sliding scale coverage        Hypertension  - continue amlodipine , metoprolol  succinate     Tremor  - medication side effect versus Parkinson's     Bipolar disorder/depression  - continue Lamictal , risperidone      Hypothyroid  - continue levothyroxine      Hyperlipidemia  - continue atorvastatin     Hospitalist personally evaluated and examined the patient in conjunction with the MLP and agree with the assessments, treatment plan and disposition of the patient as recorded by the [facility].       DVT/PE Prophylaxis:  Lovenox    Daily Orders (From admission, onward)      None          Anticoagulants (last 24 hours)       None             Discharge Planning:  Home discharge    Verneita Montclair, APRN  [facility] Hospitalist

## 2023-09-30 NOTE — Care Plan (Signed)
 [facility]  Care Plan Note    Situation: Patient admitted 09/29/23 with COVID.    Intervention: Monitoring labs and vitals. Assisting with ADLs. Neb treatments and supplemental O2 ongoing. PO steroid. Isolation precautions in place. Call bell in reach. Bed alarm on. Non skid socks on. Patient education ongoing.    Response: Patient is tolerating current treatment well at this time.      Espiridion Console, RN        Problem: Adult Inpatient Plan of Care  Goal: Patient-Specific Goal (Individualized)  Outcome: Ongoing (see interventions/notes)  Flowsheets (Taken 09/30/2023 0945)  Individualized Care Needs: monitor labs and vitals, assist with ADLs as needed  Anxieties, Fears or Concerns: wants to go home     Problem: Adult Inpatient Plan of Care  Goal: Absence of Hospital-Acquired Illness or Injury  Intervention: Identify and Manage Fall Risk  Recent Flowsheet Documentation  Taken 09/30/2023 0945 by Espiridion  Safety Promotion/Fall Prevention:   activity supervised   fall prevention program maintained   motion sensor pad activated   nonskid shoes/slippers when out of bed   safety round/check completed     Problem: Adult Inpatient Plan of Care  Goal: Absence of Hospital-Acquired Illness or Injury  Intervention: Prevent Skin Injury  Recent Flowsheet Documentation  Taken 09/30/2023 0945 by Espiridion  Skin Protection:   adhesive use limited   tubing/devices free from skin contact     Problem: Adult Inpatient Plan of Care  Goal: Absence of Hospital-Acquired Illness or Injury  Intervention: Prevent and Manage VTE (Venous Thromboembolism) Risk  Recent Flowsheet Documentation  Taken 09/30/2023 0945 by Espiridion  VTE Prevention/Management:   ambulation promoted   dorsiflexion/plantar flexion performed     Problem: Fall Injury Risk  Goal: Absence of Fall and Fall-Related Injury  Intervention: Promote Injury-Free Environment  Recent Flowsheet Documentation  Taken 09/30/2023 0945 by Mgm Mirage  Safety Promotion/Fall  Prevention:   activity supervised   fall prevention program maintained   motion sensor pad activated   nonskid shoes/slippers when out of bed   safety round/check completed     Problem: Skin Injury Risk Increased  Goal: Skin Health and Integrity  Intervention: Optimize Skin Protection  Recent Flowsheet Documentation  Taken 09/30/2023 0945 by Espiridion  Pressure Reduction Techniques:   Mobility is maximized   Moisture, shear and nutrition are maximized   Frequent weight shifting encouraged  Pressure Reduction Devices: Repositioning wedges/pillows utilized  Skin Protection:   adhesive use limited   tubing/devices free from skin contact     Problem: Adult Inpatient Plan of Care  Goal: Optimal Comfort and Wellbeing  Intervention: Provide Person-Centered Care  Recent Flowsheet Documentation  Taken 09/30/2023 0945 by Espiridion  Trust Relationship/Rapport:   care explained   choices provided   questions answered   questions encouraged   reassurance provided   thoughts/feelings acknowledged

## 2023-10-01 DIAGNOSIS — R9431 Abnormal electrocardiogram [ECG] [EKG]: Secondary | ICD-10-CM

## 2023-10-01 LAB — ECG 12 LEAD
Atrial Rate: 89 {beats}/min
Calculated P Axis: 54 degrees
Calculated R Axis: 33 degrees
Calculated T Axis: 39 degrees
PR Interval: 170 ms
QRS Duration: 74 ms
QT Interval: 372 ms
QTC Calculation: 452 ms
Ventricular rate: 89 {beats}/min

## 2023-10-01 LAB — THROAT CULTURE, BETA HEMOLYTIC STREPTOCOCCUS: THROAT CULTURE: NORMAL

## 2023-10-01 LAB — CBC WITH DIFF
BASOPHIL #: 0 x10ˆ3/uL (ref 0.00–0.10)
BASOPHIL %: 0 % (ref 0–1)
EOSINOPHIL #: 0 x10ˆ3/uL (ref 0.00–0.50)
EOSINOPHIL %: 0 % — ABNORMAL LOW (ref 1–7)
HCT: 36.1 % (ref 31.2–41.9)
HGB: 12.4 g/dL (ref 10.9–14.3)
LYMPHOCYTE #: 0.7 x10ˆ3/uL — ABNORMAL LOW (ref 1.10–3.10)
LYMPHOCYTE %: 19 % (ref 16–46)
MCH: 28.9 pg (ref 24.7–32.8)
MCHC: 34.2 g/dL (ref 32.3–35.6)
MCV: 84.4 fL (ref 75.5–95.3)
MONOCYTE #: 0.2 x10ˆ3/uL (ref 0.20–0.90)
MONOCYTE %: 6 % (ref 4–11)
MPV: 6.3 fL — ABNORMAL LOW (ref 7.9–10.8)
NEUTROPHIL #: 3 x10ˆ3/uL (ref 1.90–8.20)
NEUTROPHIL %: 75 % (ref 43–77)
PLATELETS: 218 x10ˆ3/uL (ref 140–440)
RBC: 4.27 x10ˆ6/uL (ref 3.63–4.92)
RDW: 15 % (ref 12.3–17.7)
WBC: 4 x10ˆ3/uL (ref 3.8–11.8)

## 2023-10-01 LAB — COMPREHENSIVE METABOLIC PANEL, NON-FASTING
ALBUMIN/GLOBULIN RATIO: 1.3 (ref 0.8–1.4)
ALBUMIN: 3.6 g/dL (ref 3.5–5.7)
ALKALINE PHOSPHATASE: 61 U/L (ref 34–104)
ALT (SGPT): 11 U/L (ref 7–52)
ANION GAP: 8 mmol/L (ref 4–13)
AST (SGOT): 12 U/L — ABNORMAL LOW (ref 13–39)
BILIRUBIN TOTAL: 0.3 mg/dL (ref 0.3–1.0)
BUN/CREA RATIO: 15 (ref 6–22)
BUN: 16 mg/dL (ref 7–25)
CALCIUM, CORRECTED: 9.1 mg/dL (ref 8.9–10.8)
CALCIUM: 8.8 mg/dL (ref 8.6–10.3)
CHLORIDE: 108 mmol/L — ABNORMAL HIGH (ref 98–107)
CO2 TOTAL: 24 mmol/L (ref 21–31)
CREATININE: 1.04 mg/dL (ref 0.60–1.30)
ESTIMATED GFR: 61 mL/min/1.73mˆ2 (ref >59)
GLOBULIN: 2.7 (ref 2.0–3.5)
GLUCOSE: 144 mg/dL — ABNORMAL HIGH (ref 74–109)
OSMOLALITY, CALCULATED: 283 mosm/kg (ref 270–290)
POTASSIUM: 3.9 mmol/L (ref 3.5–5.1)
PROTEIN TOTAL: 6.3 g/dL — ABNORMAL LOW (ref 6.4–8.9)
SODIUM: 140 mmol/L (ref 136–145)

## 2023-10-01 LAB — SPUTUM SCREEN

## 2023-10-01 LAB — POC BLOOD GLUCOSE (RESULTS)
GLUCOSE, POC: 134 mg/dL — ABNORMAL HIGH (ref 70–100)
GLUCOSE, POC: 173 mg/dL — ABNORMAL HIGH (ref 70–100)

## 2023-10-01 LAB — PROCALCITONIN: PROCALCITONIN: 0.02 ng/mL (ref <0.50)

## 2023-10-01 LAB — MAGNESIUM: MAGNESIUM: 2.1 mg/dL (ref 1.9–2.7)

## 2023-10-01 MED ORDER — IPRATROPIUM 0.5 MG-ALBUTEROL 3 MG (2.5 MG BASE)/3 ML NEBULIZATION SOLN
3.0000 mL | INHALATION_SOLUTION | Freq: Four times a day (QID) | RESPIRATORY_TRACT | Status: DC | PRN
Start: 2023-10-01 — End: 2023-10-01

## 2023-10-01 MED ORDER — DOXYCYCLINE HYCLATE 100 MG CAPSULE
100.0000 mg | ORAL_CAPSULE | Freq: Two times a day (BID) | ORAL | 0 refills | Status: AC
Start: 2023-10-01 — End: 2023-10-05

## 2023-10-01 NOTE — Nurses Notes (Signed)
 Patient discharged home with family. IV and tele box removed. All patients belongings packed. AVS reviewed. All questions and concerns answered. Patient taken down to vehicle via wheelchair by PCT.

## 2023-10-01 NOTE — Nurses Notes (Signed)
 Patient walked 250 feet. O2 sat dropped to 88%. Patient recovered quickly after resting.

## 2023-10-01 NOTE — Discharge Summary (Signed)
 [facility]  DISCHARGE SUMMARY    PATIENT NAME:  Julie Cain, Julie Cain  MRN:  [id number]  DOB:  1960/11/13    ENCOUNTER DATE:  09/29/2023  INPATIENT ADMISSION DATE: 09/29/2023  DISCHARGE DATE:  10/01/2023    ATTENDING PHYSICIAN: Twanna Labrum, MD  SERVICE: PRN HOSPITALIST 5  PRIMARY CARE PHYSICIAN: Powell Ahle, PA-C       No lay caregiver identified.    PRIMARY DISCHARGE DIAGNOSIS: COVID  Active Hospital Problems    Diagnosis Date Noted    Principal Problem: COVID [U07.1] 09/29/2023      Resolved Hospital Problems   No resolved problems to display.     Active Non-Hospital Problems    Diagnosis Date Noted    Low back pain 04/04/2023    Bloody stools 10/08/2021    HTN (hypertension) 10/08/2021    Type 2 diabetes mellitus 10/08/2021    Hypothyroidism 10/08/2021    GERD (gastroesophageal reflux disease) 10/08/2021    Bipolar disorder 10/08/2021    Tremor 10/08/2021             Current Discharge Medication List        START taking these medications.        Details   doxycycline  hyclate 100 mg Capsule  Commonly known as: VIBRAMYCIN    100 mg, Oral, 2 TIMES DAILY  Qty: 8 Capsule  Refills: 0            CONTINUE these medications - NO CHANGES were made during your visit.        Details   amLODIPine  5 mg Tablet  Commonly known as: NORVASC    5 mg, EVERY EVENING WITH DINNER  Refills: 0     atorvastatin  40 mg Tablet  Commonly known as: LIPITOR   40 mg, NIGHTLY  Refills: 0     cholecalciferol  (vitamin D3) 25 mcg (1,000 unit) Tablet   1,000 Units, Daily  Refills: 0     lamotrigine  100 mg Tablet  Commonly known as: LAMICTAL    100 mg, Oral, 2 TIMES DAILY, Takes daily at noon and at bedtime   Refills: 0     levothyroxine  112 mcg Tablet  Commonly known as: SYNTHROID    1 Tablet (112 mcg total) Every morning  Refills: 0     metoprolol  succinate 50 mg Tablet Sustained Release 24 hr  Commonly known as: TOPROL -XL   1.5 Tablets (75 mg total) Twice daily  Refills: 0     NOVOLOG FLEXPEN U-100 INSULIN  SUBQ   10 Units, Subcutaneous, 3  TIMES DAILY WITH MEALS  Refills: 0     omeprazole 40 mg Capsule, Delayed Release(E.C.)  Commonly known as: PRILOSEC   Take 1 Capsule (40 mg total) by mouth Once a day  Refills: 0     OneTouch Verio test strips Strip  Generic drug: Blood Sugar Diagnostic   USE TO TEST BLOOD SUGAR TWICE DAILY  Refills: 0     Ozempic 0.25 mg or 0.5 mg (2 mg/3 mL) Pen Injector  Generic drug: semaglutide   0.5 mg, EVERY 7 DAYS  Refills: 0     risperiDONE  2 mg Tablet  Commonly known as: risperDAL    2 mg, NIGHTLY  Refills: 0     Semglee (insulin  glarg-yfgn)Pen 100 unit/mL (3 mL) Insulin  Pen  Generic drug: insulin  glargine-yfgn   30 Units Twice daily  Refills: 0     sucralfate  1 gram Tablet  Commonly known as: CARAFATE    1 g, 2 TIMES DAILY  Refills: 0     topiramate   25 mg Tablet  Commonly known as: TOPAMAX    25 mg, Oral, 2 TIMES DAILY MORNING/BEDTIME  Refills: 0            Discharge med list refreshed?  YES     Allergies[1]  HOSPITAL PROCEDURE(S):   No orders of the defined types were placed in this encounter.      REASON FOR HOSPITALIZATION AND HOSPITAL COURSE   BRIEF HPI:  This is a 63 y.o., female admitted for COVID and acute hypoxic respiratory failure.  BRIEF HOSPITAL NARRATIVE:     This is a 63 year old female patient who presents to the emergency room with complaints of sore throat and cough congestion and shortness of breath for 1 week.  She has a history of sleep apnea with nocturnal oxygen use however she is noncompliant with oxygen.  Respiratory 4 Plex was positive for COVID. She was found to be hypoxic with PO2 of 62 on room air ABG. Chest x-ray showed basilar atelectasis or airspace disease. Oxygen saturation did drop to 81% with ambulation.  She denied fever chills nausea or vomiting. She refused remdesivir.  She did not receive the COVID vaccine. She was treated with IV ceftriaxone , azithromycin , and oral dexamethasone .  CT of the chest without contrast showed faint bilateral peripheral ground-glass densities in the lower  lobes could be related to viral process.  She has remained afebrile and hemodynamically stable.  She is medically stable to be discharged home with home oxygen. She is to continue doxycycline  twice daily x4 days.  She refuses to be discharged home on dexamethasone . She is to follow up with the primary care provider in 1 week.    Physical Exam  Vitals reviewed.   Constitutional:       Appearance: She is obese.   HENT:      Head: Normocephalic and atraumatic.      Mouth/Throat:      Mouth: Mucous membranes are moist.      Pharynx: Oropharynx is clear.   Cardiovascular:      Rate and Rhythm: Normal rate and regular rhythm.      Pulses: Normal pulses.      Heart sounds: Normal heart sounds.   Pulmonary:      Effort: Pulmonary effort is normal.      Breath sounds: Rhonchi present.      Comments: O2 2 L nasal cannula    Abdominal:      General: Bowel sounds are normal.      Palpations: Abdomen is soft.   Skin:     General: Skin is warm and dry.      Capillary Refill: Capillary refill takes less than 2 seconds.   Neurological:      Mental Status: She is alert and oriented to person, place, and time.      Comments: tremor noted            TRANSITION/POST DISCHARGE CARE/PENDING TESTS/REFERRALS:    - doxycycline  100 mg twice daily X 4 days.  - O2 2 L nasal cannula  - follow up PCP 1 week      CONDITION ON DISCHARGE:  A. Ambulation: Full ambulation  B. Self-care Ability: Complete  C. Cognitive Status Alert and Oriented x 3  D. Code status at discharge:       LINES/DRAINS/WOUNDS AT DISCHARGE:   Patient Lines/Drains/Airways Status       Active Line / Dialysis Catheter / Dialysis Graft / Drain / Airway / Wound  Name Placement date Placement time Site Days    Peripheral IV Left Basilic  (medial side of arm) 09/29/23  1429  -- 1                    DISCHARGE DISPOSITION:  Home discharge  DISCHARGE INSTRUCTIONS:  Post-Discharge Follow Up Appointments       Follow up with Bluford Moats, PA-C in 1 week    Phone: [phone]     Where: [address] [place] [facility] [address]      Tuesday Oct 08, 2023    Ultrasound with PRN US  ROOM 1 at  9:30 AM      [facility], [facility], [place]   [address]  [place] [address]  [phone]          No discharge procedures on file.       Verneita Montclair, APRN    Copies sent to Care Team         Relationship Specialty Notifications Start End    Bluford Moats, [place] PCP - General PHYSICIAN ASSISTANT  09/[birth year]     Phone: [phone] Fax: [phone]         [address] [place] [address]            Referring providers can utilize [email or web address] to access their referred [facility] Medicine patient's information.                               [1]   Allergies  Allergen Reactions    Metformin Nausea/ Vomiting

## 2023-10-02 LAB — STREPTOCOCCUS PNEUMONIAE ANTIGEN,URINE: S.PNEUMONIAE ANTIGEN: NEGATIVE

## 2023-10-02 LAB — LEGIONELLA URINE ANTIGEN: LEGIONELLA ANTIGEN: NEGATIVE

## 2023-10-08 ENCOUNTER — Ambulatory Visit

## 2023-10-11 ENCOUNTER — Telehealth (HOSPITAL_COMMUNITY): Payer: Self-pay

## 2023-11-27 ENCOUNTER — Other Ambulatory Visit (HOSPITAL_COMMUNITY): Payer: Self-pay | Admitting: PHYSICIAN ASSISTANT

## 2023-11-27 DIAGNOSIS — Z1231 Encounter for screening mammogram for malignant neoplasm of breast: Secondary | ICD-10-CM

## 2023-12-23 ENCOUNTER — Ambulatory Visit: Payer: Self-pay

## 2024-03-20 ENCOUNTER — Ambulatory Visit: Admit: 2024-03-20 | Admitting: Internal Medicine

## 2024-03-20 ENCOUNTER — Encounter (HOSPITAL_COMMUNITY): Payer: Self-pay

## 2024-03-20 SURGERY — ESOPHAGOSCOPY FLEXIBLE
Anesthesia: General
